# Patient Record
Sex: Female | Born: 1975
Health system: Southern US, Community
[De-identification: ages and names within clinical notes are randomized; demographics above are authoritative.]

---

## 2005-06-30 ENCOUNTER — Emergency Department: Payer: Self-pay | Admitting: Emergency Medicine

## 2006-11-29 ENCOUNTER — Emergency Department: Payer: Self-pay | Admitting: Emergency Medicine

## 2010-07-26 ENCOUNTER — Emergency Department: Payer: Self-pay | Admitting: Internal Medicine

## 2012-07-02 ENCOUNTER — Encounter: Payer: Self-pay | Admitting: Family Medicine

## 2012-08-01 ENCOUNTER — Emergency Department: Payer: Self-pay | Admitting: Emergency Medicine

## 2012-08-04 ENCOUNTER — Emergency Department: Payer: Self-pay | Admitting: Emergency Medicine

## 2014-01-13 ENCOUNTER — Ambulatory Visit: Payer: Self-pay | Admitting: Family Medicine

## 2015-02-21 IMAGING — US US BREAST*R* LIMITED INC AXILLA
1 series · 6 of 6 positions shown · non-contrast
Comparison: None.

CLINICAL DATA: Patient presents for a bilateral diagnostic
evaluation due to a palpable abnormality over the 10 o'clock
position of the right breast first noticed 6 months ago. Patient
states that she can no longer feel this abnormality.

EXAM:
DIGITAL DIAGNOSTIC  BILATERAL MAMMOGRAM WITH CAD
ULTRASOUND RIGHT BREAST

[Series 1: us breast*right* limited inc axilla · 0.08mm/px · 6 of 6 slices shown]
[im 1/6]
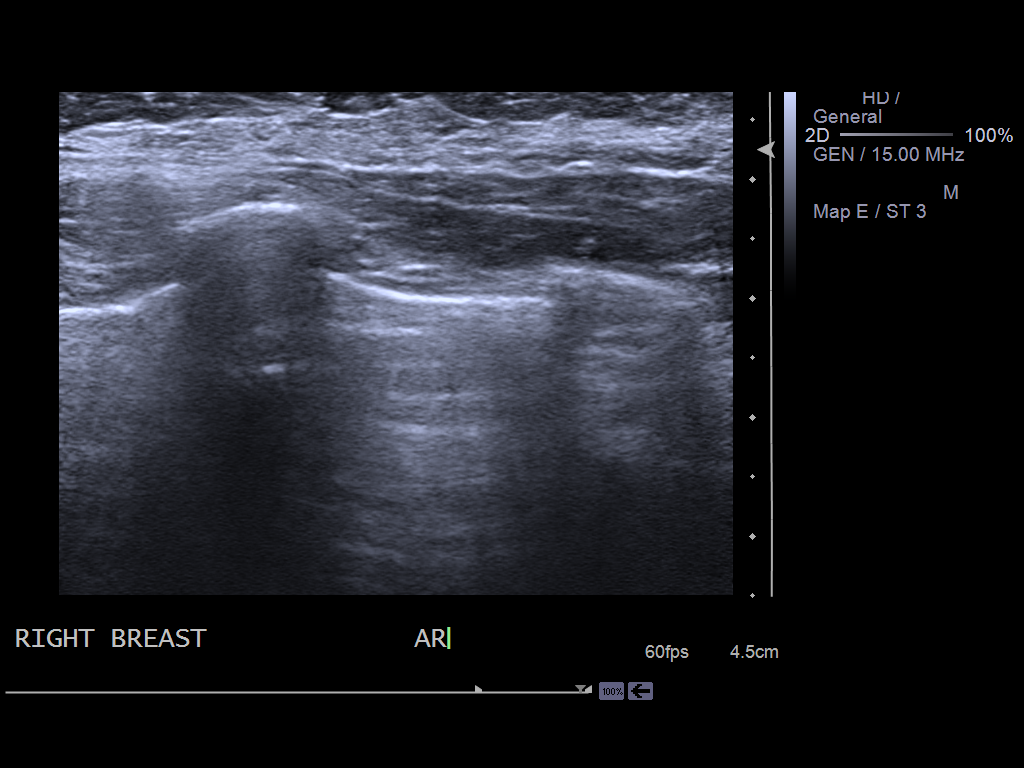
[im 2/6]
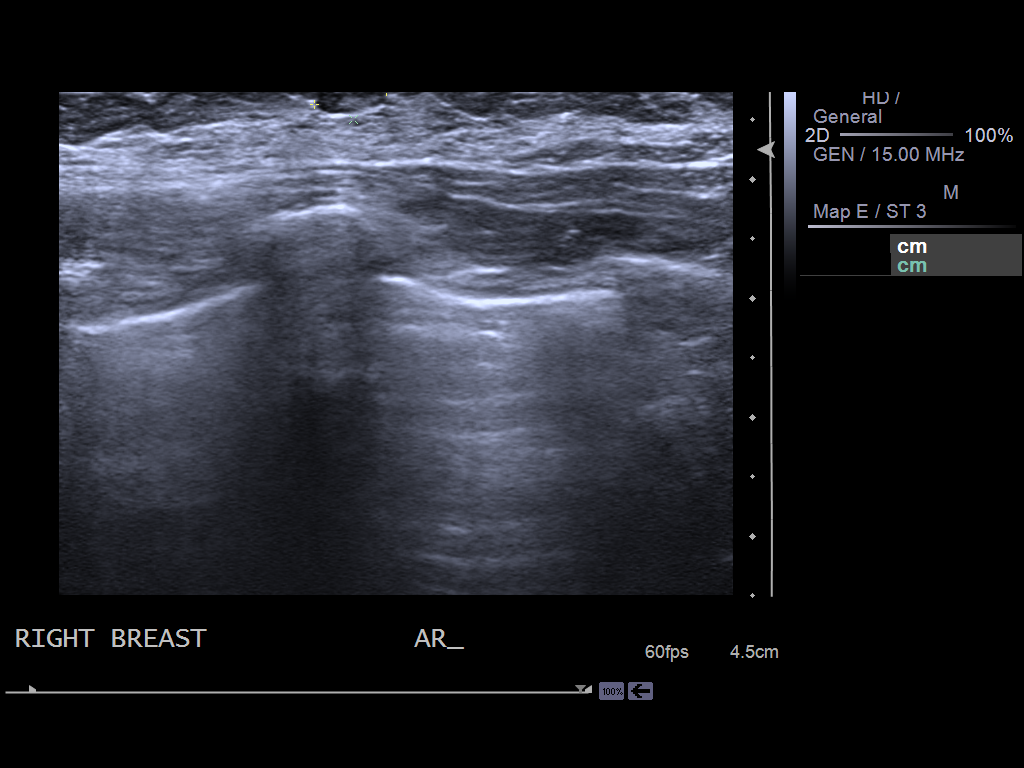
[im 3/6]
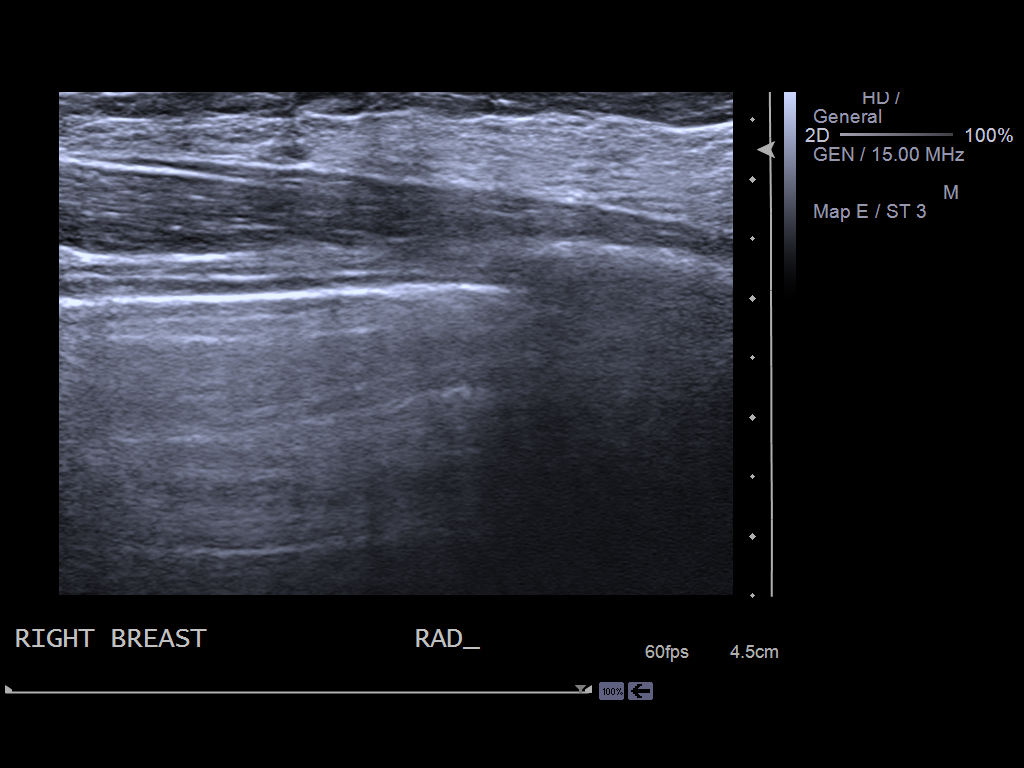
[im 4/6]
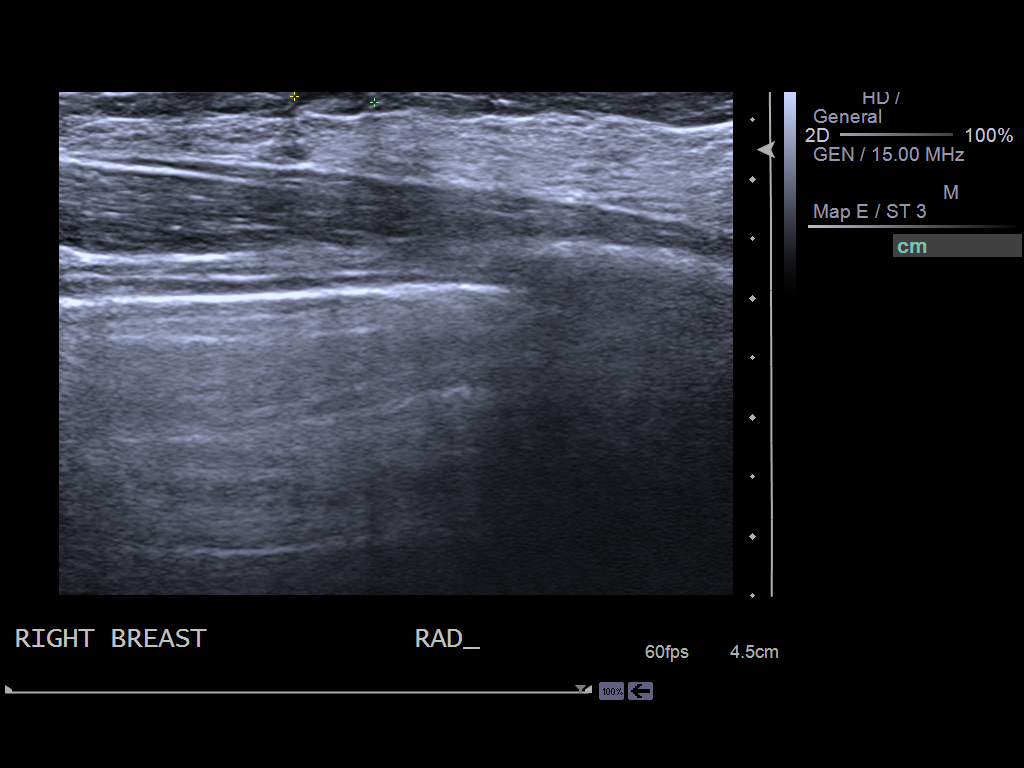
[im 5/6]
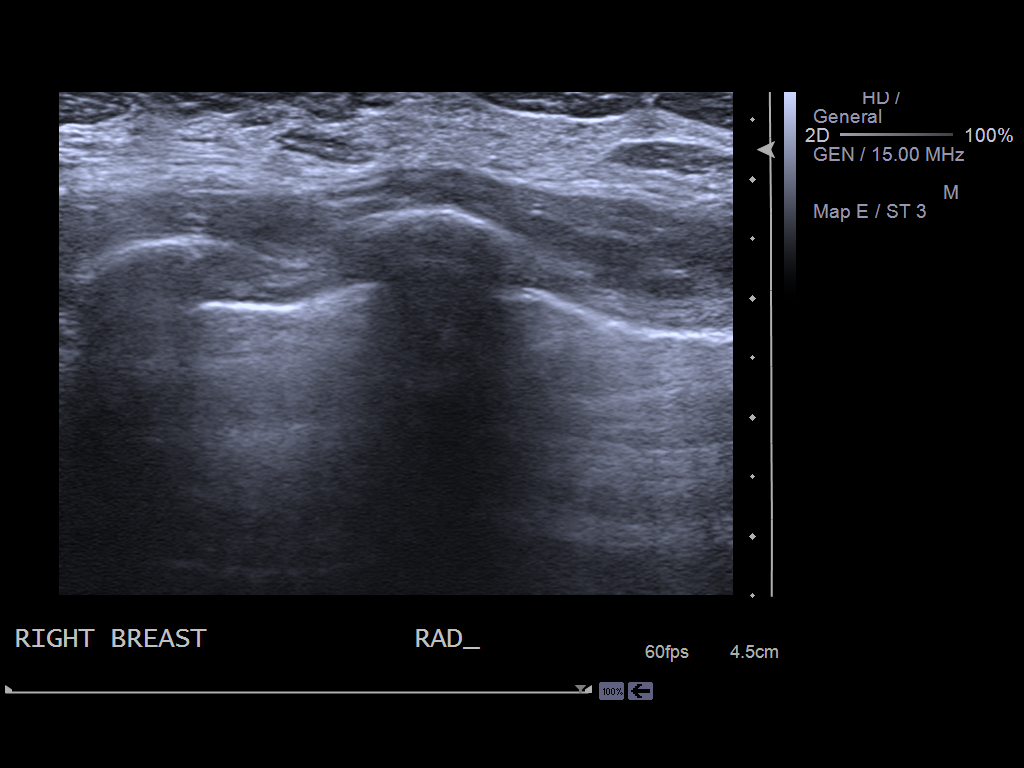
[im 6/6]
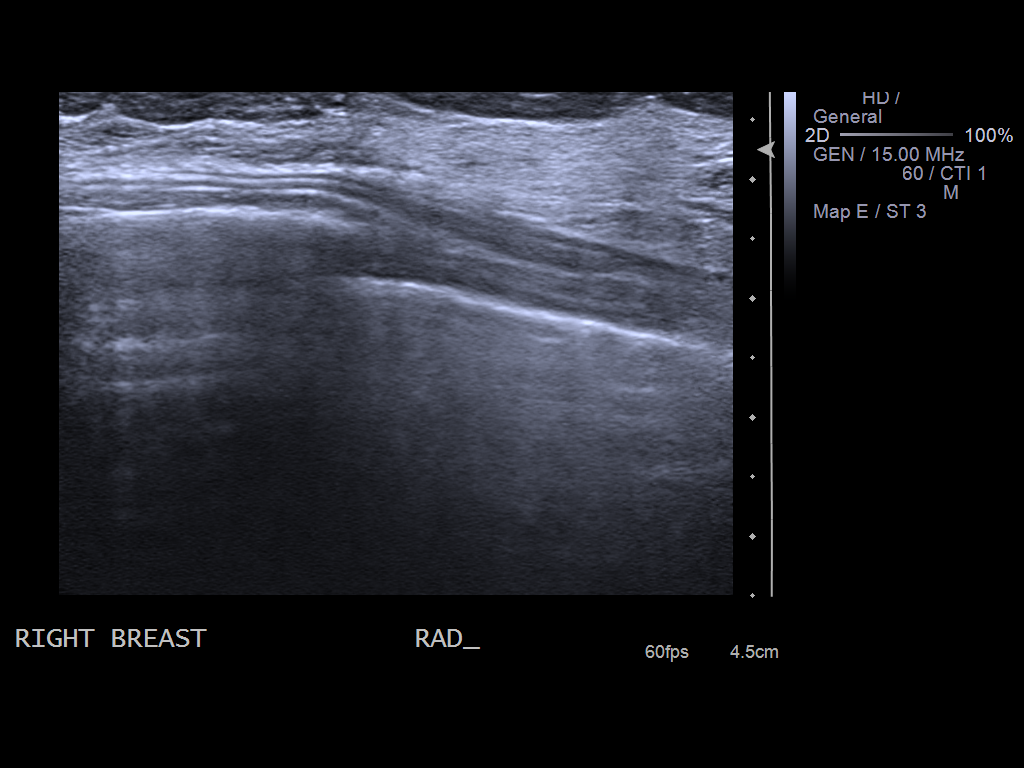

[6 of 6 positions shown; findings below may reference images not displayed]

ACR Breast Density Category b: There are scattered areas of
fibroglandular density.
FINDINGS: Examination demonstrates 2 small ovoid circumscribed masses in the
area of patient's previous palpable abnormality measuring 3 mm and 6
mm respectively over the upper outer breast. Remainder of the exam
is unremarkable.

Mammographic images were processed with CAD.

On physical exam, I palpate a small 5-6 mm superficial nodular
density over the 10 to 11 o'clock position of the right breast
approximately 8 cm from the nipple.

Ultrasound is performed, showing 2 adjacent circumscribed ovoid
intramammary lymph nodes immediately below the skin surface at the
10 to 11 o'clock position of the right breast 8 cm from the nipple
with the larger measuring 3 x 6 x 7 mm in the smaller measuring 3 x
4 x 4 mm. These correspond to the area of patient's previous
palpable abnormality.
IMPRESSION: Two adjacent intramammary lymph nodes at the 10 to 11 o'clock
position of the right breast 8 cm from the nipple with the larger
measuring 3 x 6 x 7 mm likely accounting for patient's palpable
abnormality.

RECOMMENDATION:
Recommend annual bilateral mammographic screening beginning at age
40.

I have discussed the findings and recommendations with the patient.
Results were also provided in writing at the conclusion of the
visit. If applicable, a reminder letter will be sent to the patient
regarding the next appointment.

BI-RADS CATEGORY  2: Benign.

## 2015-10-19 ENCOUNTER — Emergency Department
Admission: EM | Admit: 2015-10-19 | Discharge: 2015-10-19 | Disposition: A | Discharge status: Home / Self Care | Payer: Self-pay

## 2015-10-19 NOTE — ED Notes (Signed)
When pt was aware that her supervisor was to be notified for workers comp request pt became very agitated and got up and stormed out stating she did not want to be seen, pt left ER

## 2016-11-08 ENCOUNTER — Encounter: Payer: Self-pay | Admitting: Advanced Practice Midwife

## 2018-07-17 ENCOUNTER — Emergency Department: Payer: BLUE CROSS/BLUE SHIELD

## 2018-07-17 ENCOUNTER — Emergency Department
Admission: EM | Admit: 2018-07-17 | Discharge: 2018-07-17 | Disposition: A | Discharge status: Home / Self Care | Payer: BLUE CROSS/BLUE SHIELD | Attending: Student in an Organized Health Care Education/Training Program | Admitting: Student in an Organized Health Care Education/Training Program

## 2018-07-17 ENCOUNTER — Encounter: Payer: Self-pay | Admitting: *Deleted

## 2018-07-17 ENCOUNTER — Other Ambulatory Visit: Payer: Self-pay

## 2018-07-17 DIAGNOSIS — R0789 Other chest pain: Secondary | ICD-10-CM | POA: Insufficient documentation

## 2018-07-17 DIAGNOSIS — R079 Chest pain, unspecified: Secondary | ICD-10-CM

## 2018-07-17 LAB — BASIC METABOLIC PANEL
Anion gap: 6 (ref 5–15)
BUN: 11 mg/dL (ref 6–20)
CO2: 26 mmol/L (ref 22–32)
Calcium: 8.9 mg/dL (ref 8.9–10.3)
Chloride: 104 mmol/L (ref 98–111)
Creatinine, Ser: 0.55 mg/dL (ref 0.44–1.00)
GFR calc Af Amer: >60 mL/min (ref >60)
GFR calc non Af Amer: >60 mL/min (ref >60)
Glucose, Bld: 89 mg/dL (ref 70–99)
Potassium: 3.9 mmol/L (ref 3.5–5.1)
Sodium: 136 mmol/L (ref 135–145)

## 2018-07-17 LAB — CBC
HEMATOCRIT: 38.2 % (ref 36.0–46.0)
Hemoglobin: 12 g/dL (ref 12.0–15.0)
MCH: 27.8 pg (ref 26.0–34.0)
MCHC: 31.4 g/dL (ref 30.0–36.0)
MCV: 88.4 fL (ref 80.0–100.0)
Platelets: 269 10*3/uL (ref 150–400)
RBC: 4.32 MIL/uL (ref 3.87–5.11)
RDW: 13.3 % (ref 11.5–15.5)
WBC: 6.1 10*3/uL (ref 4.0–10.5)
nRBC: 0 % (ref 0.0–0.2)

## 2018-07-17 LAB — TROPONIN I: Troponin I: <0.03 ng/mL (ref <0.03)

## 2018-07-17 MED ORDER — CYCLOBENZAPRINE HCL 10 MG PO TABS: 10.0000 mg | ORAL_TABLET | Freq: Three times a day (TID) | ORAL | 0 refills | Status: AC | PRN

## 2018-07-17 NOTE — ED Provider Notes (Signed)
[facility] Emergency Department Provider Note    First MD Initiated Contact with Patient 07/17/18 1539     (approximate)  I have reviewed the triage vital signs and the nursing notes.   HISTORY  Chief Complaint Chest Pain    HPI Isaias Sakaiishira Pablo is a 43 y.o. female below listed past medical history presents the ER with intermittent brief stabbing chest discomfort in the anterior chest wall the last roughly 3 to 5 minutes.  Happened 2 times yesterday.  It was improved after drinking water.  Denied any diaphoresis nausea or vomiting.  Did not feel like she was about to faint.  Denies any shortness of breath.  No pleuritic pain.  She does not smoke.  Is not on OCP.  Denies any history of heart disease.  No recent fevers.  Currently pain-free.  Works [employer] doing lots of heavy lifting and pulling.    History reviewed. No pertinent past medical history. History reviewed. No pertinent family history. Past Surgical History:  Procedure Laterality Date  . CESAREAN SECTION     There are no active problems to display for this patient.     Prior to Admission medications   Medication Sig Start Date End Date Taking? Authorizing Provider  cyclobenzaprine (FLEXERIL) 10 MG tablet Take 1 tablet (10 mg total) by mouth 3 (three) times daily as needed for muscle spasms. 07/17/18   Willy Eddyobinson, Winry Egnew, MD    Allergies Patient has no allergy information on record.    Social History Social History   Tobacco Use  . Smoking status: Never Smoker  . Smokeless tobacco: Never Used  Substance Use Topics  . Alcohol use: Never    Frequency: Never  . Drug use: Never    Review of Systems Patient denies headaches, rhinorrhea, blurry vision, numbness, shortness of breath, chest pain, edema, cough, abdominal pain, nausea, vomiting, diarrhea, dysuria, fevers, rashes or hallucinations unless otherwise stated above in  HPI. ____________________________________________   PHYSICAL EXAM:  VITAL SIGNS: Vitals:   07/17/18 1410  BP: 118/72  Pulse: 72  Resp: 16  Temp: 98.5 F (36.9 C)  SpO2: 100%    Constitutional: Alert and oriented.  Eyes: Conjunctivae are normal.  Head: Atraumatic. Nose: No congestion/rhinnorhea. Mouth/Throat: Mucous membranes are moist.   Neck: No stridor. Painless ROM.  Cardiovascular: Normal rate, regular rhythm. Grossly normal heart sounds.  Good peripheral circulation. Respiratory: Normal respiratory effort.  No retractions. Lungs CTAB. Gastrointestinal: Soft and nontender. No distention. No abdominal bruits. No CVA tenderness. Genitourinary:  Musculoskeletal: No lower extremity tenderness nor edema.  No joint effusions. Neurologic:  Normal speech and language. No gross focal neurologic deficits are appreciated. No facial droop Skin:  Skin is warm, dry and intact. No rash noted. Psychiatric: Mood and affect are normal. Speech and behavior are normal.  ____________________________________________   LABS (all labs ordered are listed, but only abnormal results are displayed)  Results for orders placed or performed during the hospital encounter of 07/17/18 (from the past 24 hour(s))  Basic metabolic panel     Status: None   Collection Time: 07/17/18  2:16 PM  Result Value Ref Range   Sodium 136 135 - 145 mmol/L   Potassium 3.9 3.5 - 5.1 mmol/L   Chloride 104 98 - 111 mmol/L   CO2 26 22 - 32 mmol/L   Glucose, Bld 89 70 - 99 mg/dL   BUN 11 6 - 20 mg/dL   Creatinine, Ser 1.610.55 0.44 - 1.00 mg/dL  Calcium 8.9 8.9 - 10.3 mg/dL   GFR calc non Af Amer >60 >60 mL/min   GFR calc Af Amer >60 >60 mL/min   Anion gap 6 5 - 15  CBC     Status: None   Collection Time: 07/17/18  2:16 PM  Result Value Ref Range   WBC 6.1 4.0 - 10.5 K/uL   RBC 4.32 3.87 - 5.11 MIL/uL   Hemoglobin 12.0 12.0 - 15.0 g/dL   HCT 24.8 25.0 - 03.7 %   MCV 88.4 80.0 - 100.0 fL   MCH 27.8 26.0 - 34.0  pg   MCHC 31.4 30.0 - 36.0 g/dL   RDW 04.8 88.9 - 16.9 %   Platelets 269 150 - 400 K/uL   nRBC 0.0 0.0 - 0.2 %  Troponin I - ONCE - STAT     Status: None   Collection Time: 07/17/18  2:16 PM  Result Value Ref Range   Troponin I <0.03 <0.03 ng/mL   ____________________________________________  EKG My review and personal interpretation at Time: 13"52   Indication: chest pain  Rate: 75  Rhythm: sinus Axis: normal Other: normal intervals, no stemi ____________________________________________  RADIOLOGY  I personally reviewed all radiographic images ordered to evaluate for the above acute complaints and reviewed radiology reports and findings.  These findings were personally discussed with the patient.  Please see medical record for radiology report.  ____________________________________________   PROCEDURES  Procedure(s) performed:  Procedures    Critical Care performed: no ____________________________________________   INITIAL IMPRESSION / ASSESSMENT AND PLAN / ED COURSE  Pertinent labs & imaging results that were available during my care of the patient were reviewed by me and considered in my medical decision making (see chart for details).   DDX: ACS, pericarditis, esophagitis, boerhaaves, pe, dissection, pna, bronchitis, costochondritis   Beckett Vecchiarelli is a 43 y.o. who presents to the ED with symptoms as described above.  Patient afebrile hemodynamically stable.  Blood work sent for the above differential is reassuring.  She is low risk heart score.  This not consistent with ACS particular as the patient is pain-free at this time.  Doubt dysrhythmia.  Patient is low risk by Wells criteria and is PERC negative.  No evidence of pneumothorax or pneumonia.  No evidence of CHF.  No evidence of intra-abdominal process causing chest discomfort.  Not consistent with dissection.  She stable and appropriate for outpatient follow-up.      As part of my medical decision making, I  reviewed the following data within the electronic MEDICAL RECORD NUMBER Nursing notes reviewed and incorporated, Labs reviewed, notes from prior ED visits.   ____________________________________________   FINAL CLINICAL IMPRESSION(S) / ED DIAGNOSES  Final diagnoses:  Chest pain, unspecified type      NEW MEDICATIONS STARTED DURING THIS VISIT:  New Prescriptions   CYCLOBENZAPRINE (FLEXERIL) 10 MG TABLET    Take 1 tablet (10 mg total) by mouth 3 (three) times daily as needed for muscle spasms.     Note:  This document was prepared using Dragon voice recognition software and may include unintentional dictation errors.    Willy Eddy, MD 07/17/18 [phone]

## 2018-07-17 NOTE — ED Triage Notes (Signed)
PT to ED reporting centralized sharp pains in her chest that started yesterday and are intermittent in nature. No cough or congestion. No SOB od dizziness. NO tenderness upon palpation.

## 2018-07-17 NOTE — ED Notes (Signed)
Pt reports chest pain located across chest (more on right side) non radiating that started yesterday - denies shortness of breath, dizziness, N/V

## 2018-07-19 DIAGNOSIS — E7800 Pure hypercholesterolemia, unspecified: Secondary | ICD-10-CM | POA: Diagnosis not present

## 2018-07-19 DIAGNOSIS — E663 Overweight: Secondary | ICD-10-CM | POA: Diagnosis not present

## 2018-07-19 DIAGNOSIS — E049 Nontoxic goiter, unspecified: Secondary | ICD-10-CM | POA: Diagnosis not present

## 2018-07-19 DIAGNOSIS — J309 Allergic rhinitis, unspecified: Secondary | ICD-10-CM | POA: Diagnosis not present

## 2018-07-19 DIAGNOSIS — N3281 Overactive bladder: Secondary | ICD-10-CM | POA: Diagnosis not present

## 2018-08-28 DIAGNOSIS — D649 Anemia, unspecified: Secondary | ICD-10-CM | POA: Diagnosis not present

## 2018-08-28 DIAGNOSIS — E049 Nontoxic goiter, unspecified: Secondary | ICD-10-CM | POA: Diagnosis not present

## 2018-08-28 DIAGNOSIS — E663 Overweight: Secondary | ICD-10-CM | POA: Diagnosis not present

## 2018-08-28 DIAGNOSIS — J309 Allergic rhinitis, unspecified: Secondary | ICD-10-CM | POA: Diagnosis not present

## 2018-08-28 DIAGNOSIS — N3281 Overactive bladder: Secondary | ICD-10-CM | POA: Diagnosis not present

## 2019-01-24 ENCOUNTER — Ambulatory Visit (INDEPENDENT_AMBULATORY_CARE_PROVIDER_SITE_OTHER): Payer: Worker's Compensation | Admitting: Orthopaedic Surgery

## 2019-01-24 ENCOUNTER — Telehealth: Payer: Self-pay | Admitting: Orthopaedic Surgery

## 2019-01-24 ENCOUNTER — Other Ambulatory Visit: Payer: Self-pay

## 2019-01-24 ENCOUNTER — Ambulatory Visit (INDEPENDENT_AMBULATORY_CARE_PROVIDER_SITE_OTHER): Payer: Worker's Compensation

## 2019-01-24 DIAGNOSIS — S52135A Nondisplaced fracture of neck of left radius, initial encounter for closed fracture: Secondary | ICD-10-CM | POA: Diagnosis not present

## 2019-01-24 MED ORDER — TRAMADOL HCL ER 100 MG PO TB24: 100.0000 mg | ORAL_TABLET | Freq: Three times a day (TID) | ORAL | 2 refills | Status: AC | PRN

## 2019-01-24 NOTE — Telephone Encounter (Signed)
Received call from Vinie Sill with [employer] called asked if patient can come back to work on light duty? Lars Mage said the patient can use her right arm and can walk.  The number to contact Lars Mage is [phone] Ext: [phone]

## 2019-01-24 NOTE — Progress Notes (Signed)
   Office Visit Note   Patient: Sheila Holden           Date of Birth: Jul 23, 1975           MRN: [id number] Visit Date: 01/24/2019              Requested by: No referring provider defined for this encounter. PCP: Patient, No Pcp Per   Assessment & Plan: Visit Diagnoses:  1. Closed nondisplaced fracture of neck of left radius, initial encounter     Plan: Impression is left elbow nondisplaced radial neck fracture.  We will have the patient wear her sling for maximum of 10 days.  She will then start in occupational therapy.  A prescription was provided to her today for this.  She will be nonweightbearing for 4 weeks.  She will follow-up with [place] in 2 weeks time for repeat evaluation and 3-view x-rays of the left elbow.  Prescription for tramadol was called in today.  Out of work note for 8 weeks provided as well as there is no light duty Follow-Up Instructions: Return in about 2 weeks (around 02/07/2019).   Orders:  Orders Placed This Encounter  Procedures  . XR Elbow Complete Left (3+View)   Meds ordered this encounter  Medications  . traMADol (ULTRAM-ER) 100 MG 24 hr tablet    Sig: Take 1 tablet (100 mg total) by mouth 3 (three) times daily as needed for pain.    Dispense:  30 tablet    Refill:  2      Procedures: No procedures performed   Clinical Data: No additional findings.   Subjective: Chief Complaint  Patient presents with  . Left Elbow - Injury, Fracture    HPI patient is a pleasant 43 year old female who presents to our clinic today with an injury to her left elbow.  She works for the [employer] and fell over trash yesterday landing on her left elbow.  She comes in today for further evaluation treatment recommendation.  The pain she has is to the lateral elbow.  Worse with any motion.  She has tried Motrin with mild relief of symptoms.  Review of Systems as detailed in HPI.  All others reviewed and are negative.   Objective: Vital Signs:  There were no vitals taken for this visit.  Physical Exam well-developed and well-nourished female in no acute distress.  Oriented x3.  Ortho Exam examination of her left elbow reveals marked tenderness over the radial head.  Limited range of motion secondary to pain.  She is neurovascularly intact distally.  Specialty Comments:  No specialty comments available.  Imaging: Xr Elbow Complete Left (3+view)  Result Date: 01/24/2019 X-rays demonstrate a nondisplaced radial neck fracture    PMFS History: There are no active problems to display for this patient.  No past medical history on file.  No family history on file.  Past Surgical History:  Procedure Laterality Date  . CESAREAN SECTION     Social History   Occupational History  . Not on file  Tobacco Use  . Smoking status: Never Smoker  . Smokeless tobacco: Never Used  Substance and Sexual Activity  . Alcohol use: Never    Frequency: Never  . Drug use: Never  . Sexual activity: Yes

## 2019-01-24 NOTE — Telephone Encounter (Signed)
Seth/General Manager called and stated that he wanted to know about patients visit today and wanted to speak to [employer] about possibly assigning light duty vs. Out for 8 weeks.  Advised due to HIPPA this would be a violation to speak about patient's medical condition without her consent(signed form) told him I wasn't allowed to give any information and advised I would send message to [place] office.Asked if patient had Case mgr?. He stated one hasn't been assigned to her yet.  Requested call [phone] Seth/GM

## 2019-01-24 NOTE — Telephone Encounter (Signed)
Yes no use of the affected arm

## 2019-01-24 NOTE — Telephone Encounter (Signed)
Please advise 

## 2019-01-24 NOTE — Telephone Encounter (Signed)
I don't even know who to send this to

## 2019-01-27 NOTE — Telephone Encounter (Signed)
Ok ,it's in the chart

## 2019-01-27 NOTE — Telephone Encounter (Signed)
She may return to work without use of affected extremity.  Thanks.

## 2019-01-27 NOTE — Telephone Encounter (Signed)
Is this WC?

## 2019-01-28 ENCOUNTER — Telehealth: Payer: Self-pay | Admitting: Orthopaedic Surgery

## 2019-01-28 NOTE — Telephone Encounter (Signed)
Ok to keep out of work until f/u appointment

## 2019-01-28 NOTE — Telephone Encounter (Signed)
Ok to write for desk work only

## 2019-01-28 NOTE — Telephone Encounter (Signed)
IC advised could pick up at front desk.  

## 2019-01-28 NOTE — Telephone Encounter (Signed)
IC s/w patient. 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 She is having a hard time getting in and out of truck due to arm being so painful. Please advise.

## 2019-01-28 NOTE — Telephone Encounter (Signed)
I s/w patient to advise and she stated that she feels like that she needs to be taken out of work. Please advise. If so for how long?

## 2019-01-28 NOTE — Telephone Encounter (Signed)
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  She feels that if she has to get in and out of the truck she will only aggravate the injury.  She is wanting someone to give her a call.  CB#[phone].  Thank you.

## 2019-01-28 NOTE — Telephone Encounter (Signed)
call

## 2019-01-29 ENCOUNTER — Telehealth: Payer: Self-pay

## 2019-01-29 ENCOUNTER — Telehealth: Payer: Self-pay | Admitting: Orthopaedic Surgery

## 2019-01-29 NOTE — Telephone Encounter (Signed)
Emailed the note to Seth Heath with employer ([email or web address] 

## 2019-01-29 NOTE — Telephone Encounter (Signed)
Pt called and asked that I email her oow note that was written yesterday to her employer [employer]. Emailed to [email or web address]

## 2019-01-29 NOTE — Telephone Encounter (Signed)
Emailed the note to [employer] with employer ([email or web address]

## 2019-02-05 ENCOUNTER — Ambulatory Visit: Payer: Worker's Compensation | Attending: Orthopaedic Surgery | Admitting: Occupational Therapy

## 2019-02-05 ENCOUNTER — Other Ambulatory Visit: Payer: Self-pay

## 2019-02-05 DIAGNOSIS — M25622 Stiffness of left elbow, not elsewhere classified: Secondary | ICD-10-CM | POA: Insufficient documentation

## 2019-02-05 DIAGNOSIS — M6281 Muscle weakness (generalized): Secondary | ICD-10-CM | POA: Diagnosis present

## 2019-02-05 DIAGNOSIS — M25522 Pain in left elbow: Secondary | ICD-10-CM | POA: Diagnosis present

## 2019-02-05 NOTE — Patient Instructions (Addendum)
AROM: Elbow Flexion / Extension    Gently bend elbow as far as possible. Then straighten arm as far as possible. Repeat _10-15___ times per set. Do _4-6___ sessions per day.    Elbow / Wrist Supination / Pronation   Bend elbow(s) to 90 and hold close to body. Turn palm up. Then turn palm(s) down. Keep wrist straight. Repeat sequence _10-15___ times per session. Do _4-6___ sessions per day.   Shoulder Flexion: Sitting (Single Arm)    Do not use a band, just raise arm on its own Same side hand thumb up, raise arm forward. Repeat 10__ times per set left arm Do _1_ sets per session. 2-3x day  http://tub.exer.us/282   Copyright  VHI. All rights reserved.     SHOULDER: Abduction Bilateral    Raise left arm arms out  with palms up. Keep elbows straight. Do not shrug shoulders. 10___ reps per set, __3_ sets per day, _7__ days per week   Copyright  VHI. All rights reserved.    Remove your sling if you are watching TV and can support your arm on a pillow, wear your sling for protection when up moving around or sleeping

## 2019-02-05 NOTE — Therapy (Signed)
[facility] [address] [place], [place], [address] Phone: [phone]   Fax:  [phone]  Occupational Therapy Evaluation  Patient Details  Name: Sheila Holden MRN: [id number] Date of Birth: 05-30-1976 Referring Provider (OT): Dr. Roda ShuttersXu   Encounter Date: 02/05/2019  OT End of Session - 02/05/19 1248    Visit Number  1    Number of Visits  25    Date for OT Re-Evaluation  05/06/19    OT Start Time  1105    OT Stop Time  1150    OT Time Calculation (min)  45 min    Activity Tolerance  Patient tolerated treatment well    Behavior During Therapy  [facility] for tasks assessed/performed       No past medical history on file.  Past Surgical History:  Procedure Laterality Date  . CESAREAN SECTION      There were no vitals filed for this visit.  Subjective Assessment - 02/05/19 1212    Subjective   Pt reports pain in LUE    Patient Stated Goals  regain use of LUE    Currently in Pain?  Yes    Pain Score  6     Pain Location  Arm    Pain Orientation  Left    Pain Descriptors / Indicators  Aching    Pain Type  Acute pain    Pain Onset  1 to 4 weeks ago    Pain Frequency  Intermittent    Aggravating Factors   movement    Pain Relieving Factors  rest        [facility] - 02/05/19 1111      Assessment   Medical Diagnosis  L elbow radial neck fx    Referring Provider (OT)  Dr. Roda ShuttersXu    Onset Date/Surgical Date  01/24/19   MD appt   Hand Dominance  Right      Precautions   Precautions  Other (comment)    Precaution Comments  sling for comfort      Restrictions   Weight Bearing Restrictions  Yes    LUE Weight Bearing  Non weight bearing    Other Position/Activity Restrictions  x 4 weeks from 01/24/2019      Balance Screen   Has the patient fallen in the past 6 months  Yes    How many times?  1    Has the patient had a decrease in activity level because of a fear of falling?   Yes    Is the patient  reluctant to leave their home because of a fear of falling?   No      Home  Environment   Family/patient expects to be discharged to:  Private residence    Living Arrangements  Spouse/significant other    Lives With  Significant other      Prior Function   Level of Independence  Independent    Vocation  Full time employment    Naval architectVocation Requirements  driver for garbage    Leisure  reading, gardening      ADL   Eating/Feeding  Modified independent    Grooming  Modified independent    Upper Body Bathing  Modified independent   uses RUE   Lower Body Bathing  Modified independent    Upper Body Dressing  Moderate assistance    Lower Body Dressing  Minimal assistance    Music therapistToilet Transfer  Modified independent    Tub/Shower Transfer  Modified independent      IADL   Shopping  Completely unable to shop    Light Housekeeping  Performs light daily tasks but cannot maintain acceptable level of cleanliness    Meal Prep  Needs to have meals prepared and served      Mobility   Mobility Status  Independent      Written Expression   Dominant Hand  Right      Sensation   Light Touch  Appears Intact      Coordination   Gross Motor Movements are Fluid and Coordinated  No      ROM / Strength   AROM / PROM / Strength  AROM      AROM   Overall AROM   Deficits    Overall AROM Comments  LUE elbow flexion: 100, extension -30, pronation 60, supination 85 shoulder flexion 125               Hotpack applied to left elbow and shoulder x 10 mins prior to education regarding HEP. No adverse reactions.       OT Education - 02/05/19 1232    Education Details  A/ROM HEP    Person(s) Educated  Patient    Methods  Explanation;Demonstration;Verbal cues;Handout    Comprehension  Verbalized understanding;Returned demonstration;Verbal cues required       OT Short Term Goals - 02/05/19 1224      OT SHORT TERM GOAL #1   Title  I with HEP.    Time  6    Period  Weeks    Status  New     Target Date  03/22/19      OT SHORT TERM GOAL #2   Title  Pt will increase elbow flexion to 120  for increased functional use    Baseline  100    Time  6    Period  Weeks    Status  New      OT SHORT TERM GOAL #3   Title  Pt will demonxtrate elbow ext to at least -15 in prep for functional reach.    Time  6    Period  Weeks    Status  New      OT SHORT TERM GOAL #4   Title  Pt will demonstrate forearm pronation to 85 for increased functional use.    Time  4    Period  Weeks    Status  New      OT SHORT TERM GOAL #5   Title  Pt will report pain no greater than 4/10 with perfromance of HEP.    Time  4    Period  Weeks    Status  New      OT SHORT TERM GOAL #6   Title  Pt will perform all basic ADLs modified indpendently using LUE as a non dominant assist.    Time  6    Period  Weeks    Status  New        OT Long Term Goals - 02/05/19 1241      OT LONG TERM GOAL #1   Title  I updated with HEP.    Time  12    Period  Weeks    Status  New      OT LONG TERM GOAL #2   Title  Pt will demonstrate elbow flexion/ extension WNLS for ADLs/ work activities.    Time  12    Period  Weeks  Status  New      OT LONG TERM GOAL #3   Title  Pt will demonstrate adequate LUE strength to retrieve a 3 lbs object from overhead shelf with LUE with pain no greater than 2/10.    Time  12    Period  Weeks    Status  New      OT LONG TERM GOAL #4   Title  Pt will resume performance of home management and cooking activities modified independently using LUE as a non dominant assist.    Time  12    Period  Weeks    Status  New      OT LONG TERM GOAL #5   Title  Pt will perfrom simulated work activities modified indpendently with pain no greater than 2/10,    Time  12    Period  Weeks    Status  New            Plan - 02/05/19 1218    Clinical Impression Statement  Pt is a 43 y.o female who sustained a left radial neck fx and was seen byDr. Erlinda Hong on 01/24/2019. Pt works for  [employer] and she was injured on the job. Pt presents to occupational therapy with the following deficits: pain, decreased ROM, decreased strength and decreased LUE functional use which impedes performance of ADLs/ IADLs. Pt can benefit from skilled occupational therapy to maximize pt safety and independence with daily activities.    OT Occupational Profile and History  Problem Focused Assessment - Including review of records relating to presenting problem    Occupational performance deficits (Please refer to evaluation for details):  ADL's;IADL's;Rest and Sleep;Work;Leisure;Social Participation    Body Structure / Function / Physical Skills  ADL;UE functional use;Pain;Flexibility;FMC;ROM;Gait;Coordination;GMC;Decreased knowledge of precautions;IADL;Strength;Dexterity    Rehab Potential  Good    Clinical Decision Making  Limited treatment options, no task modification necessary    Comorbidities Affecting Occupational Performance:  None    Modification or Assistance to Complete Evaluation   No modification of tasks or assist necessary to complete eval    OT Frequency  2x / week   plus eval   OT Duration  12 weeks    OT Treatment/Interventions  Self-care/ADL training;Ultrasound;Energy conservation;Aquatic Therapy;DME and/or AE instruction;Patient/family education;Balance training;Passive range of motion;Cryotherapy;Fluidtherapy;Splinting;Functional Mobility Training;Electrical Stimulation;Contrast Bath;Paraffin;Moist Heat;Therapeutic exercise;Manual Therapy;Therapeutic activities;Neuromuscular education    Plan  continue gentle A/ROM    OT Home Exercise Plan  issued gentle elbow, forearm and shoulder ROM       Patient will benefit from skilled therapeutic intervention in order to improve the following deficits and impairments:   Body Structure / Function / Physical Skills: ADL, UE functional use, Pain, Flexibility, FMC, ROM, Gait, Coordination, GMC, Decreased knowledge of precautions, IADL,  Strength, Dexterity       Visit Diagnosis: 1. Stiffness of left elbow, not elsewhere classified   2. Pain in left elbow   3. Muscle weakness (generalized)       Problem List There are no active problems to display for this patient.   Bernedette Auston 02/05/2019, 1:31 PM  [facility] [address] [place], [place], [address] Phone: [phone]   Fax:  [phone]  Name: Sheila Holden MRN: [id number] Date of Birth: [birth year]

## 2019-02-07 ENCOUNTER — Ambulatory Visit (INDEPENDENT_AMBULATORY_CARE_PROVIDER_SITE_OTHER): Payer: Worker's Compensation

## 2019-02-07 ENCOUNTER — Ambulatory Visit (INDEPENDENT_AMBULATORY_CARE_PROVIDER_SITE_OTHER): Payer: Worker's Compensation | Admitting: Orthopaedic Surgery

## 2019-02-07 ENCOUNTER — Encounter: Payer: Self-pay | Admitting: Orthopaedic Surgery

## 2019-02-07 VITALS — Ht 65.0 in | Wt 173.0 lb

## 2019-02-07 DIAGNOSIS — M25522 Pain in left elbow: Secondary | ICD-10-CM

## 2019-02-07 NOTE — Progress Notes (Signed)
   Office Visit Note   Patient: Sheila Holden           Date of Birth: [birth year]           MRN: [id number] Visit Date: 02/07/2019              Requested by: No referring provider defined for this encounter. PCP: Patient, No Pcp Per   Assessment & Plan: Visit Diagnoses:  1. Pain in left elbow     Plan: At this point she is to continue her OT.  I will write her out of work for another 2 weeks.  She may wean the sling as tolerated.  Follow-up in 4 weeks for recheck.  No x-rays needed if she is doing well.  Follow-Up Instructions: Return in about 4 weeks (around 03/07/2019).   Orders:  Orders Placed This Encounter  Procedures  . XR Elbow Complete Left (3+View)   No orders of the defined types were placed in this encounter.     Procedures: No procedures performed   Clinical Data: No additional findings.   Subjective: Chief Complaint  Patient presents with  . Left Elbow - Follow-up    DOI 01/23/2019    Sheila Holden returns today for follow-up of her left elbow.  She states that the pain is improving as well as the swelling is improving.  She has good and bad days.  She has been out of work.  She has begun OT this past week.   Review of Systems   Objective: Vital Signs: Ht 5\' 5"  (1.651 m)   Wt 173 lb (78.5 kg)   LMP 01/07/2019   BMI 28.79 kg/m   Physical Exam  Ortho Exam Elbow range of motion is actually pretty good.  She has full pronation supination. Specialty Comments:  No specialty comments available.  Imaging: Xr Elbow Complete Left (3+view)  Result Date: 02/07/2019 There is some spurring of the radial head.  Spurring of the proximal radial ulnar joint.  No obvious evidence of fracture lucency.    PMFS History: There are no active problems to display for this patient.  No past medical history on file.  No family history on file.  Past Surgical History:  Procedure Laterality Date  . CESAREAN SECTION     Social History   Occupational History   . Not on file  Tobacco Use  . Smoking status: Never Smoker  . Smokeless tobacco: Never Used  Substance and Sexual Activity  . Alcohol use: Never    Frequency: Never  . Drug use: Never  . Sexual activity: Yes

## 2019-02-14 ENCOUNTER — Ambulatory Visit: Payer: Worker's Compensation | Attending: Orthopaedic Surgery | Admitting: Occupational Therapy

## 2019-02-14 ENCOUNTER — Other Ambulatory Visit: Payer: Self-pay

## 2019-02-14 ENCOUNTER — Encounter: Payer: Self-pay | Admitting: Occupational Therapy

## 2019-02-14 DIAGNOSIS — M25522 Pain in left elbow: Secondary | ICD-10-CM | POA: Diagnosis not present

## 2019-02-14 DIAGNOSIS — M6281 Muscle weakness (generalized): Secondary | ICD-10-CM

## 2019-02-14 DIAGNOSIS — M25622 Stiffness of left elbow, not elsewhere classified: Secondary | ICD-10-CM | POA: Insufficient documentation

## 2019-02-14 NOTE — Therapy (Signed)
[facility] [address] [place], [place], [address] Phone: [phone]   Fax:  [phone]  Occupational Therapy Treatment  Patient Details  Name: Sheila Holden MRN: [id number] Date of Birth: 08/31/1975 Referring Provider (OT): Dr. Roda ShuttersXu   Encounter Date: 02/14/2019  OT End of Session - 02/14/19 0814    Visit Number  2    Number of Visits  25    Date for OT Re-Evaluation  05/06/19    Authorization Type  Workers Comp approved for 16 visit along with case re-eval    Authorization - Visit Number  2    Authorization - Number of Visits  16    OT Start Time  [phone]    OT Stop Time  0855    OT Time Calculation (min)  43 min    Activity Tolerance  Patient tolerated treatment well    Behavior During Therapy  [facility] for tasks assessed/performed       History reviewed. No pertinent past medical history.  Past Surgical History:  Procedure Laterality Date  . CESAREAN SECTION      There were no vitals filed for this visit.  Subjective Assessment - 02/14/19 0814    Subjective   Pt reports pain in LUE    Patient Stated Goals  regain use of LUE    Currently in Pain?  Yes    Pain Score  5     Pain Location  Arm    Pain Orientation  Left    Pain Descriptors / Indicators  Aching    Pain Type  Acute pain    Pain Onset  1 to 4 weeks ago    Pain Frequency  Intermittent    Aggravating Factors   movement    Pain Relieving Factors  rest         [facility] OT Assessment - 02/14/19 0001      AROM   Overall AROM Comments  LUE elbow flexion: 125*, extension -18*, pronation 75*, supination 95* shoulder flexion 140*        Hot pack x8 min to L elbow for pain/stiffness with no adverse reactions.  AAROM elbow flex/ext, shoulder flex and abduction with cane x10 each with min cueing.              OT Education - 02/14/19 0836    Education Details  Reviewed AROM HEP--min v.c. for head compensation with shoulder movement and  compensation supination/pronation    Person(s) Educated  Patient    Methods  Explanation;Demonstration;Verbal cues    Comprehension  Verbalized understanding;Returned demonstration;Verbal cues required       OT Short Term Goals - 02/14/19 0830      OT SHORT TERM GOAL #1   Title  I with HEP.    Time  6    Period  Weeks    Status  New    Target Date  03/22/19      OT SHORT TERM GOAL #2   Title  Pt will increase elbow flexion to 120  for increased functional use    Baseline  100    Time  6    Period  Weeks    Status  Achieved   02/14/19:  125*     OT SHORT TERM GOAL #3   Title  Pt will demonxtrate elbow ext to at least -15 in prep for functional reach.    Time  6    Period  Weeks    Status  New  OT SHORT TERM GOAL #4   Title  Pt will demonstrate forearm pronation to 85 for increased functional use.    Time  4    Period  Weeks    Status  New      OT SHORT TERM GOAL #5   Title  Pt will report pain no greater than 4/10 with perfromance of HEP.    Time  4    Period  Weeks    Status  New      OT SHORT TERM GOAL #6   Title  Pt will perform all basic ADLs modified indpendently using LUE as a non dominant assist.    Time  6    Period  Weeks    Status  New        OT Long Term Goals - 02/05/19 1241      OT LONG TERM GOAL #1   Title  I updated with HEP.    Time  12    Period  Weeks    Status  New      OT LONG TERM GOAL #2   Title  Pt will demonstrate elbow flexion/ extension WNLS for ADLs/ work activities.    Time  12    Period  Weeks    Status  New      OT LONG TERM GOAL #3   Title  Pt will demonstrate adequate LUE strength to retrieve a 3 lbs object from overhead shelf with LUE with pain no greater than 2/10.    Time  12    Period  Weeks    Status  New      OT LONG TERM GOAL #4   Title  Pt will resume performance of home management and cooking activities modified independently using LUE as a non dominant assist.    Time  12    Period  Weeks    Status   New      OT LONG TERM GOAL #5   Title  Pt will perfrom simulated work activities modified indpendently with pain no greater than 2/10,    Time  12    Period  Weeks    Status  New            Plan - 02/14/19 0815    Clinical Impression Statement  Pt is progressing towards goals with improving AROM this week.    OT Occupational Profile and History  Problem Focused Assessment - Including review of records relating to presenting problem    Occupational performance deficits (Please refer to evaluation for details):  ADL's;IADL's;Rest and Sleep;Work;Leisure;Social Participation    Body Structure / Function / Physical Skills  ADL;UE functional use;Pain;Flexibility;FMC;ROM;Gait;Coordination;GMC;Decreased knowledge of precautions;IADL;Strength;Dexterity    Rehab Potential  Good    Clinical Decision Making  Limited treatment options, no task modification necessary    Comorbidities Affecting Occupational Performance:  None    Modification or Assistance to Complete Evaluation   No modification of tasks or assist necessary to complete eval    OT Frequency  2x / week   plus eval   OT Duration  12 weeks    OT Treatment/Interventions  Self-care/ADL training;Ultrasound;Energy conservation;Aquatic Therapy;DME and/or AE instruction;Patient/family education;Balance training;Passive range of motion;Cryotherapy;Fluidtherapy;Splinting;Functional Mobility Training;Electrical Stimulation;Contrast Bath;Paraffin;Moist Heat;Therapeutic exercise;Manual Therapy;Therapeutic activities;Neuromuscular education    Plan  continue gentle A/ROM    OT Home Exercise Plan  issued gentle elbow, forearm and shoulder ROM       Patient will benefit from skilled therapeutic intervention in order to improve  the following deficits and impairments:   Body Structure / Function / Physical Skills: ADL, UE functional use, Pain, Flexibility, FMC, ROM, Gait, Coordination, GMC, Decreased knowledge of precautions, IADL, Strength,  Dexterity       Visit Diagnosis: Stiffness of left elbow, not elsewhere classified  Pain in left elbow  Muscle weakness (generalized)    Problem List There are no active problems to display for this patient.   [facility] 02/14/2019, 9:21 AM  [facility] [address] [place], [place], [address] Phone: [phone]   Fax:  [phone]  Name: Kanoelani Dobies MRN: [id number] Date of Birth: Jun 28, 1975   Vianne Bulls, OTR/L [facility] [address] [place],   [address] [phone] phone [phone] 02/14/19 9:21 AM

## 2019-02-17 ENCOUNTER — Telehealth: Payer: Self-pay | Admitting: Orthopaedic Surgery

## 2019-02-17 NOTE — Telephone Encounter (Signed)
Patient called stating her PT was delayed and patient is requesting more therapy before returning to work. Patient has only has 2 sessions. Wants to be able to do therapy until her return date (?)  Please call patient to advise [phone]

## 2019-02-17 NOTE — Telephone Encounter (Signed)
Please advise. Thanks.  

## 2019-02-18 ENCOUNTER — Other Ambulatory Visit: Payer: Self-pay

## 2019-02-18 ENCOUNTER — Ambulatory Visit: Payer: Worker's Compensation | Admitting: Occupational Therapy

## 2019-02-18 ENCOUNTER — Telehealth: Payer: Self-pay | Admitting: Orthopaedic Surgery

## 2019-02-18 ENCOUNTER — Encounter: Payer: Self-pay | Admitting: Occupational Therapy

## 2019-02-18 DIAGNOSIS — M25522 Pain in left elbow: Secondary | ICD-10-CM

## 2019-02-18 DIAGNOSIS — M25622 Stiffness of left elbow, not elsewhere classified: Secondary | ICD-10-CM

## 2019-02-18 DIAGNOSIS — M6281 Muscle weakness (generalized): Secondary | ICD-10-CM | POA: Diagnosis not present

## 2019-02-18 NOTE — Telephone Encounter (Signed)
Is this ok?

## 2019-02-18 NOTE — Telephone Encounter (Signed)
Patient aware note at front desk  

## 2019-02-18 NOTE — Patient Instructions (Signed)
  ROM: Abduction - Wand   Holding wand with left hand palm up, push wand directly out to side, leading with other hand palm down, until stretch is felt. Hold 3 seconds. Repeat 10 times per set. Do 2 sessions per day. (Lying down)     [employer] - Standing   With arms straight, hold cane forward at waist. Raise cane above head. Hold 3 seconds. Repeat 10 times. Do 2 times per day.     SELF ASSISTED WITH OBJECT: Elbow Flexion / Extension    Hold cane with both hands. Bend and straighten elbows. 10 reps per set, 1 sets per day

## 2019-02-18 NOTE — Therapy (Signed)
[facility] [address] [place], [place], [address] Phone: [phone]   Fax:  [phone]  Occupational Therapy Treatment  Patient Details  Name: Isaias Sakaiishira Schippers MRN: [id number] Date of Birth: 1976-05-29 Referring Provider (OT): Dr. Roda ShuttersXu   Encounter Date: 02/18/2019  OT End of Session - 02/18/19 0745    Visit Number  3    Number of Visits  25    Date for OT Re-Evaluation  05/06/19    Authorization Type  [employer] approved for 16 visit along with case re-eval    Authorization - Visit Number  23    Authorization - Number of Visits  16    OT Start Time  0727   started late due to pt in restroom + hot pack (no charge)   OT Stop Time  0800    OT Time Calculation (min)  33 min    Activity Tolerance  Patient tolerated treatment well    Behavior During Therapy  [facility] for tasks assessed/performed       History reviewed. No pertinent past medical history.  Past Surgical History:  Procedure Laterality Date  . CESAREAN SECTION      There were no vitals filed for this visit.  Subjective Assessment - 02/18/19 0728    Subjective   it's up this am    Patient Stated Goals  regain use of LUE    Currently in Pain?  Yes    Pain Score  6     Pain Location  Elbow    Pain Orientation  Left    Pain Descriptors / Indicators  Aching    Pain Type  Acute pain    Pain Onset  1 to 4 weeks ago    Pain Frequency  Constant    Aggravating Factors   movement    Pain Relieving Factors  rest        Hot pack x696min to L elbow for pain/stiffness with no adverse reactions.  AROM:  Shoulder flex, elbow flex/ext, supination/pronation with min v.c.  Arm bike x5 min level 1 for reciprocal movement, AROM/AAROM      OT Education - 02/18/19 0740    Education Details  Cane Exercises for gentle AAROM--see pt instructions    Person(s) Educated  Patient    Methods  Explanation;Demonstration;Verbal cues;Handout    Comprehension   Verbalized understanding;Returned demonstration       OT Short Term Goals - 02/14/19 0830      OT SHORT TERM GOAL #1   Title  I with HEP.    Time  6    Period  Weeks    Status  New    Target Date  03/22/19      OT SHORT TERM GOAL #2   Title  Pt will increase elbow flexion to 120  for increased functional use    Baseline  100    Time  6    Period  Weeks    Status  Achieved   02/14/19:  125*     OT SHORT TERM GOAL #3   Title  Pt will demonxtrate elbow ext to at least -15 in prep for functional reach.    Time  6    Period  Weeks    Status  New      OT SHORT TERM GOAL #4   Title  Pt will demonstrate forearm pronation to 85 for increased functional use.    Time  4    Period  Weeks  Status  New      OT SHORT TERM GOAL #5   Title  Pt will report pain no greater than 4/10 with perfromance of HEP.    Time  4    Period  Weeks    Status  New      OT SHORT TERM GOAL #6   Title  Pt will perform all basic ADLs modified indpendently using LUE as a non dominant assist.    Time  6    Period  Weeks    Status  New        OT Long Term Goals - 02/05/19 1241      OT LONG TERM GOAL #1   Title  I updated with HEP.    Time  12    Period  Weeks    Status  New      OT LONG TERM GOAL #2   Title  Pt will demonstrate elbow flexion/ extension WNLS for ADLs/ work activities.    Time  12    Period  Weeks    Status  New      OT LONG TERM GOAL #3   Title  Pt will demonstrate adequate LUE strength to retrieve a 3 lbs object from overhead shelf with LUE with pain no greater than 2/10.    Time  12    Period  Weeks    Status  New      OT LONG TERM GOAL #4   Title  Pt will resume performance of home management and cooking activities modified independently using LUE as a non dominant assist.    Time  12    Period  Weeks    Status  New      OT LONG TERM GOAL #5   Title  Pt will perfrom simulated work activities modified indpendently with pain no greater than 2/10,    Time  12     Period  Weeks    Status  New            Plan - 02/18/19 0729    Clinical Impression Statement  Pt continues to make progress towards goals, but reports continued moderate pain.    OT Occupational Profile and History  Problem Focused Assessment - Including review of records relating to presenting problem    Occupational performance deficits (Please refer to evaluation for details):  ADL's;IADL's;Rest and Sleep;Work;Leisure;Social Participation    Body Structure / Function / Physical Skills  ADL;UE functional use;Pain;Flexibility;FMC;ROM;Gait;Coordination;GMC;Decreased knowledge of precautions;IADL;Strength;Dexterity    Rehab Potential  Good    Clinical Decision Making  Limited treatment options, no task modification necessary    Comorbidities Affecting Occupational Performance:  None    Modification or Assistance to Complete Evaluation   No modification of tasks or assist necessary to complete eval    OT Frequency  2x / week   plus eval   OT Duration  12 weeks    OT Treatment/Interventions  Self-care/ADL training;Ultrasound;Energy conservation;Aquatic Therapy;DME and/or AE instruction;Patient/family education;Balance training;Passive range of motion;Cryotherapy;Fluidtherapy;Splinting;Functional Mobility Training;Electrical Stimulation;Contrast Bath;Paraffin;Moist Heat;Therapeutic exercise;Manual Therapy;Therapeutic activities;Neuromuscular education    Plan  continue gentle A/ROM    OT Home Exercise Plan  issued gentle elbow, forearm and shoulder ROM       Patient will benefit from skilled therapeutic intervention in order to improve the following deficits and impairments:   Body Structure / Function / Physical Skills: ADL, UE functional use, Pain, Flexibility, FMC, ROM, Gait, Coordination, GMC, Decreased knowledge of precautions, IADL, Strength, Dexterity  Visit Diagnosis: Stiffness of left elbow, not elsewhere classified  Pain in left elbow  Muscle weakness  (generalized)    Problem List There are no active problems to display for this patient.   [facility] 02/18/2019, 7:48 AM  [facility] [address] [place], [place], [address] Phone: [phone]   Fax:  [phone]  Name: Vern Moo MRN: [id number] Date of Birth: [birth year]   Willa Frater, OTR/L [facility] [address] [place], [place]  [address] [phone] phone [phone] 02/18/19 7:48 AM

## 2019-02-18 NOTE — Telephone Encounter (Signed)
Can we get more therapy for patient?

## 2019-02-18 NOTE — Telephone Encounter (Signed)
Patient left a voicemail message requesting a out of work note for 2 more weeks.  Patient stated that her arm is still in a lot of pain and this would give her time to finish her PT.  CB#[phone].  Thank you.

## 2019-02-18 NOTE — Telephone Encounter (Signed)
Ok to do

## 2019-02-18 NOTE — Telephone Encounter (Signed)
yes

## 2019-02-19 DIAGNOSIS — N911 Secondary amenorrhea: Secondary | ICD-10-CM | POA: Diagnosis not present

## 2019-02-19 DIAGNOSIS — Z6830 Body mass index (BMI) 30.0-30.9, adult: Secondary | ICD-10-CM | POA: Diagnosis not present

## 2019-02-20 ENCOUNTER — Ambulatory Visit: Payer: Worker's Compensation | Admitting: Occupational Therapy

## 2019-02-26 ENCOUNTER — Ambulatory Visit: Payer: Worker's Compensation | Attending: Orthopaedic Surgery | Admitting: Occupational Therapy

## 2019-02-26 ENCOUNTER — Other Ambulatory Visit: Payer: Self-pay

## 2019-02-26 DIAGNOSIS — M25622 Stiffness of left elbow, not elsewhere classified: Secondary | ICD-10-CM | POA: Insufficient documentation

## 2019-02-26 DIAGNOSIS — M6281 Muscle weakness (generalized): Secondary | ICD-10-CM | POA: Diagnosis not present

## 2019-02-26 DIAGNOSIS — M25522 Pain in left elbow: Secondary | ICD-10-CM | POA: Diagnosis not present

## 2019-02-26 NOTE — Therapy (Signed)
[facility] [address] [place], [place], [address] Phone: [phone]   Fax:  [phone]  Occupational Therapy Treatment  Patient Details  Name: Sheila Holden MRN: [id number] Date of Birth: 06/28/1975 Referring Provider (OT): Dr. Roda ShuttersXu   Encounter Date: 02/26/2019  OT End of Session - 02/26/19 0833    Visit Number  4    Number of Visits  25    Date for OT Re-Evaluation  05/06/19    Authorization Type  Work[employer] Comp approved for 16 visit along with case re-eval    Authorization - Visit Number  24    Authorization - Number of Visits  16    OT Start Time  0810    OT Stop Time  0838   Pt 10 min late   OT Time Calculation (min)  28 min    Activity Tolerance  Patient tolerated treatment well    Behavior During Therapy  [facility] for tasks assessed/performed       No past medical history on file.  Past Surgical History:  Procedure Laterality Date  . CESAREAN SECTION      There were no vitals filed for this visit.  Subjective Assessment - 02/26/19 0811    Subjective   I see the MD on 01/04/19    Pertinent History  radial neck fx 01/23/19 - no surgery    Currently in Pain?  Yes    Pain Score  4     Pain Location  Elbow    Pain Orientation  Left    Pain Descriptors / Indicators  Aching    Pain Type  Acute pain    Pain Onset  1 to 4 weeks ago    Pain Frequency  Constant    Aggravating Factors   movement    Pain Relieving Factors  rest        A/ROM in elbow flex, ext, supination and pronation x 10 reps Forearm gym x 4 reps UBE x 8 min. Level 1 for elbow ROM Elbow flex = 122-125*, ext = WFL's, sup/pron = WFL's  Pt politely declined ice at end of session                    OT Short Term Goals - 02/14/19 0830      OT SHORT TERM GOAL #1   Title  I with HEP.    Time  6    Period  Weeks    Status  New    Target Date  03/22/19      OT SHORT TERM GOAL #2   Title  Pt will increase elbow flexion  to 120  for increased functional use    Baseline  100    Time  6    Period  Weeks    Status  Achieved   02/14/19:  125*     OT SHORT TERM GOAL #3   Title  Pt will demonxtrate elbow ext to at least -15 in prep for functional reach.    Time  6    Period  Weeks    Status  New      OT SHORT TERM GOAL #4   Title  Pt will demonstrate forearm pronation to 85 for increased functional use.    Time  4    Period  Weeks    Status  New      OT SHORT TERM GOAL #5   Title  Pt will report pain no greater than  4/10 with perfromance of HEP.    Time  4    Period  Weeks    Status  New      OT SHORT TERM GOAL #6   Title  Pt will perform all basic ADLs modified indpendently using LUE as a non dominant assist.    Time  6    Period  Weeks    Status  New        OT Long Term Goals - 02/05/19 1241      OT LONG TERM GOAL #1   Title  I updated with HEP.    Time  12    Period  Weeks    Status  New      OT LONG TERM GOAL #2   Title  Pt will demonstrate elbow flexion/ extension WNLS for ADLs/ work activities.    Time  12    Period  Weeks    Status  New      OT LONG TERM GOAL #3   Title  Pt will demonstrate adequate LUE strength to retrieve a 3 lbs object from overhead shelf with LUE with pain no greater than 2/10.    Time  12    Period  Weeks    Status  New      OT LONG TERM GOAL #4   Title  Pt will resume performance of home management and cooking activities modified independently using LUE as a non dominant assist.    Time  12    Period  Weeks    Status  New      OT LONG TERM GOAL #5   Title  Pt will perfrom simulated work activities modified indpendently with pain no greater than 2/10,    Time  12    Period  Weeks    Status  New            Plan - 02/26/19 0938    Clinical Impression Statement  Pt progressing with ROM but appears hesistant to use LUE    Occupational performance deficits (Please refer to evaluation for details):  ADL's;IADL's;Rest and  Sleep;Work;Leisure;Social Participation    Body Structure / Function / Physical Skills  ADL;UE functional use;Pain;Flexibility;FMC;ROM;Gait;Coordination;GMC;Decreased knowledge of precautions;IADL;Strength;Dexterity    Rehab Potential  Good    OT Frequency  2x / week    OT Duration  12 weeks    OT Treatment/Interventions  Self-care/ADL training;Ultrasound;Energy conservation;Aquatic Therapy;DME and/or AE instruction;Patient/family education;Balance training;Passive range of motion;Cryotherapy;Fluidtherapy;Splinting;Functional Mobility Training;Electrical Stimulation;Contrast Bath;Paraffin;Moist Heat;Therapeutic exercise;Manual Therapy;Therapeutic activities;Neuromuscular education    Plan  continue gentle A/ROM, write note to MD re: progression of therapy (P/ROM and strengthening) as pt sees MD on 03/07/19    Consulted and Agree with Plan of Care  Patient       Patient will benefit from skilled therapeutic intervention in order to improve the following deficits and impairments:   Body Structure / Function / Physical Skills: ADL, UE functional use, Pain, Flexibility, FMC, ROM, Gait, Coordination, GMC, Decreased knowledge of precautions, IADL, Strength, Dexterity       Visit Diagnosis: Pain in left elbow  Stiffness of left elbow, not elsewhere classified    Problem List There are no active problems to display for this patient.   Carey Bullocks, OTR/L 02/26/2019, 8:43 AM  [facility] [address] [place], [place], [address] Phone: [phone]   Fax:  [phone]  Name: Sheila Holden MRN: [id number] Date of Birth: Sep 14, 1975

## 2019-02-27 DIAGNOSIS — Z130 Encounter for screening for diseases of the blood and blood-forming organs and certain disorders involving the immune mechanism: Secondary | ICD-10-CM | POA: Diagnosis not present

## 2019-02-27 DIAGNOSIS — Z01419 Encounter for gynecological examination (general) (routine) without abnormal findings: Secondary | ICD-10-CM | POA: Diagnosis not present

## 2019-02-27 DIAGNOSIS — Z1231 Encounter for screening mammogram for malignant neoplasm of breast: Secondary | ICD-10-CM | POA: Diagnosis not present

## 2019-02-27 DIAGNOSIS — Z13220 Encounter for screening for lipoid disorders: Secondary | ICD-10-CM | POA: Diagnosis not present

## 2019-02-27 DIAGNOSIS — Z0000 Encounter for general adult medical examination without abnormal findings: Secondary | ICD-10-CM | POA: Diagnosis not present

## 2019-02-27 DIAGNOSIS — Z6832 Body mass index (BMI) 32.0-32.9, adult: Secondary | ICD-10-CM | POA: Diagnosis not present

## 2019-02-27 DIAGNOSIS — Z1389 Encounter for screening for other disorder: Secondary | ICD-10-CM | POA: Diagnosis not present

## 2019-02-27 DIAGNOSIS — Z1151 Encounter for screening for human papillomavirus (HPV): Secondary | ICD-10-CM | POA: Diagnosis not present

## 2019-03-04 ENCOUNTER — Ambulatory Visit: Payer: Worker's Compensation | Admitting: Occupational Therapy

## 2019-03-04 ENCOUNTER — Other Ambulatory Visit: Payer: Self-pay

## 2019-03-04 ENCOUNTER — Encounter: Payer: Self-pay | Admitting: Occupational Therapy

## 2019-03-04 DIAGNOSIS — M25622 Stiffness of left elbow, not elsewhere classified: Secondary | ICD-10-CM

## 2019-03-04 DIAGNOSIS — M6281 Muscle weakness (generalized): Secondary | ICD-10-CM

## 2019-03-04 DIAGNOSIS — M25522 Pain in left elbow: Secondary | ICD-10-CM | POA: Diagnosis not present

## 2019-03-04 NOTE — Patient Instructions (Addendum)
Dr. Erlinda Hong,  Sheila Holden is progressing well with AROM.    Her latest AROM  Elbow flexion 128*, elbow ext -23*, supination  110*, pronation 85*, shoulder flexion 165*  Please clarify current precautions:  -  Can she progress to PROM at this time?  -  Can we begin strengthening?  Please contact me with questions or concerns.   Thank you,  Vianne Bulls, OTR/L [facility] [address] [place], [place]  [address] [phone] phone [phone] 03/04/19 7:25 AM

## 2019-03-04 NOTE — Therapy (Signed)
[facility] [address] [place], [place], [address] Phone: [phone]   Fax:  [phone]  Occupational Therapy Treatment  Patient Details  Name: Sheila Holden MRN: [id number] Date of Birth: [birth year] Referring Provider (OT): Dr. Erlinda Hong   Encounter Date: 03/04/2019  OT End of Session - 03/04/19 0812    Visit Number  5    Number of Visits  25    Date for OT Re-Evaluation  05/06/19    Authorization Type  Work[employer] Comp approved for 16 visit along with case re-eval    Authorization - Visit Number  24    Authorization - Number of Visits  16    OT Start Time  0720    OT Stop Time  0801   hot pack x66min   OT Time Calculation (min)  41 min    Activity Tolerance  Patient tolerated treatment well    Behavior During Therapy  [facility] for tasks assessed/performed       History reviewed. No pertinent past medical history.  Past Surgical History:  Procedure Laterality Date  . CESAREAN SECTION      There were no vitals filed for this visit.  Subjective Assessment - 03/04/19 0731    Subjective   Pt reports that elbow is achy this morning    Pertinent History  radial neck fx 01/23/19 - no surgery    Pain Score  3     Pain Location  Elbow    Pain Orientation  Left    Pain Descriptors / Indicators  Aching    Pain Type  Acute pain    Pain Onset  1 to 4 weeks ago    Pain Frequency  Constant    Aggravating Factors   movement    Pain Relieving Factors  rest        Hot Pack x30min to L elbow with no adverse reactions for pain/stiffness.  AROM elbow flex/ext, supination/pronation x15 each.  Checked ROM--see goals section below.  Shoulder flex 165*, supination 110*.  Functional reaching to place clothespins on vertical pole for elbow ext/shoulder flex.  Forearm gym x6 with min v.c.   Arm bike x67min level 1/no resistance for ROM/conditioning     OT Education - 03/04/19 0744    Education Details  Note to MD regarding  clearance for strengthening and PROM    Person(s) Educated  Patient    Methods  Explanation;Handout    Comprehension  Verbalized understanding       OT Short Term Goals - 03/04/19 0732      OT SHORT TERM GOAL #1   Title  I with HEP.    Time  6    Period  Weeks    Status  Achieved    Target Date  03/22/19      OT SHORT TERM GOAL #2   Title  Pt will increase elbow flexion to 120  for increased functional use    Baseline  100    Time  6    Period  Weeks    Status  Achieved   02/14/19:  125*, 03/04/19  128*     OT SHORT TERM GOAL #3   Title  Pt will demonxtrate elbow ext to at least -15 in prep for functional reach.    Time  6    Period  Weeks    Status  On-going   03/04/19  -23*     OT SHORT TERM GOAL #4   Title  Pt  will demonstrate forearm pronation to 85 for increased functional use.    Time  4    Period  Weeks    Status  Achieved   03/04/19 85*     OT SHORT TERM GOAL #5   Title  Pt will report pain no greater than 4/10 with perfromance of HEP.    Time  4    Period  Weeks    Status  Achieved   03/04/19  3-4/10     OT SHORT TERM GOAL #6   Title  Pt will perform all basic ADLs modified indpendently using LUE as a non dominant assist.    Time  6    Period  Weeks    Status  On-going        OT Long Term Goals - 02/05/19 1241      OT LONG TERM GOAL #1   Title  I updated with HEP.    Time  12    Period  Weeks    Status  New      OT LONG TERM GOAL #2   Title  Pt will demonstrate elbow flexion/ extension WNLS for ADLs/ work activities.    Time  12    Period  Weeks    Status  New      OT LONG TERM GOAL #3   Title  Pt will demonstrate adequate LUE strength to retrieve a 3 lbs object from overhead shelf with LUE with pain no greater than 2/10.    Time  12    Period  Weeks    Status  New      OT LONG TERM GOAL #4   Title  Pt will resume performance of home management and cooking activities modified independently using LUE as a non dominant assist.    Time  12     Period  Weeks    Status  New      OT LONG TERM GOAL #5   Title  Pt will perfrom simulated work activities modified indpendently with pain no greater than 2/10,    Time  12    Period  Weeks    Status  New            Plan - 03/04/19 [id number]    Clinical Impression Statement  Pt is progressing with ROM and LUE functional use.    Occupational performance deficits (Please refer to evaluation for details):  ADL's;IADL's;Rest and Sleep;Work;Leisure;Social Participation    Body Structure / Function / Physical Skills  ADL;UE functional use;Pain;Flexibility;FMC;ROM;Gait;Coordination;GMC;Decreased knowledge of precautions;IADL;Strength;Dexterity    Rehab Potential  Good    OT Frequency  2x / week    OT Duration  12 weeks    OT Treatment/Interventions  Self-care/ADL training;Ultrasound;Energy conservation;Aquatic Therapy;DME and/or AE instruction;Patient/family education;Balance training;Passive range of motion;Cryotherapy;Fluidtherapy;Splinting;Functional Mobility Training;Electrical Stimulation;Contrast Bath;Paraffin;Moist Heat;Therapeutic exercise;Manual Therapy;Therapeutic activities;Neuromuscular education    Plan  note sent with pt for MD re: clearance for PROM and strengthening.  Progress per MD    Consulted and Agree with Plan of Care  Patient       Patient will benefit from skilled therapeutic intervention in order to improve the following deficits and impairments:   Body Structure / Function / Physical Skills: ADL, UE functional use, Pain, Flexibility, FMC, ROM, Gait, Coordination, GMC, Decreased knowledge of precautions, IADL, Strength, Dexterity       Visit Diagnosis: Stiffness of left elbow, not elsewhere classified  Pain in left elbow  Muscle weakness (generalized)    Problem List There  are no active problems to display for this patient.   [facility] 03/04/2019, 8:21 AM  [facility] [address] [place], [place], [address] Phone: [phone]   Fax:  [phone]  Name: Sheila Holden MRN: [id number] Date of Birth: Jul 02, 1975   Willa Frater, OTR/L [facility] [address] [place], [place]  [address] [phone] phone [phone] 03/04/19 8:21 AM

## 2019-03-04 NOTE — Patient Instructions (Addendum)
   Dr. Erlinda Hong,  Letta Kocher is progressing well with AROM.    Her latest AROM  Elbow flexion, elbow ext, supination, pronation, shoulder flexion  Please clarify current precautions:  -  Can she begin PROM at this time?  -  Can we begin strengthening?  Please contact me with questions or concerns.   Thank you,  Vianne Bulls, OTR/L [facility] [address] [place], [place]  [address] [phone] phone [phone] 03/04/19 7:25 AM

## 2019-03-05 ENCOUNTER — Telehealth: Payer: Self-pay | Admitting: Orthopaedic Surgery

## 2019-03-05 NOTE — Telephone Encounter (Signed)
Patient called stated that [employer] has her form and they are going to complete it tomorrow. She asking if we could complete form today. I advised patient we are unable to complete as we have an afternoon full of providers seeing patients and no clinical staff available to complete the forms. Told her that I would get the forms signed as soon as I get them from [employer].

## 2019-03-07 ENCOUNTER — Other Ambulatory Visit: Payer: Self-pay

## 2019-03-07 ENCOUNTER — Encounter: Payer: Self-pay | Admitting: Orthopaedic Surgery

## 2019-03-07 ENCOUNTER — Ambulatory Visit (INDEPENDENT_AMBULATORY_CARE_PROVIDER_SITE_OTHER): Payer: Worker's Compensation | Admitting: Orthopaedic Surgery

## 2019-03-07 VITALS — Ht 65.0 in | Wt 173.0 lb

## 2019-03-07 DIAGNOSIS — S52135A Nondisplaced fracture of neck of left radius, initial encounter for closed fracture: Secondary | ICD-10-CM

## 2019-03-07 NOTE — Progress Notes (Signed)
      Patient: Sheila Holden           Date of Birth: 1975-10-21           MRN: [id number] Visit Date: 03/07/2019 PCP: Patient, No Pcp Per   Assessment & Plan:  Chief Complaint:  Chief Complaint  Patient presents with  . Left Elbow - Follow-up   Visit Diagnoses:  1. Closed nondisplaced fracture of neck of left radius, initial encounter     Plan: Patient is a pleasant 43 year old female who presents our clinic today 6 weeks status post left nondisplaced radial neck fracture, date of injury 01/23/2019.  She has been doing well.  She has no pain.  She has been in physical therapy and has regained near full range of motion.  She has not returned to work as she has a very physical job working for the [employer].  Examination of her left elbow reveals no tenderness to the fracture site.  she has full extension.  She has near full flexion.  Near full strength.  She is neurovascular intact distally.  At this point, she will continue with physical therapy where they will start with strengthening exercises.  We will keep her out of work for another 2 weeks.  Follow-up with [place] in 2 weeks time for likely release back to work full duty.  Call with concerns or questions.  Follow-Up Instructions: Return in about 2 weeks (around 03/21/2019).   Orders:  No orders of the defined types were placed in this encounter.  No orders of the defined types were placed in this encounter.   Imaging: No new imaging  PMFS History: There are no active problems to display for this patient.  History reviewed. No pertinent past medical history.  History reviewed. No pertinent family history.  Past Surgical History:  Procedure Laterality Date  . CESAREAN SECTION     Social History   Occupational History  . Not on file  Tobacco Use  . Smoking status: Never Smoker  . Smokeless tobacco: Never Used  Substance and Sexual Activity  . Alcohol use: Never    Frequency: Never  . Drug use: Never  .  Sexual activity: Yes

## 2019-03-12 ENCOUNTER — Other Ambulatory Visit: Payer: Self-pay

## 2019-03-12 ENCOUNTER — Encounter: Payer: Self-pay | Admitting: Occupational Therapy

## 2019-03-12 ENCOUNTER — Telehealth: Payer: Self-pay | Admitting: Orthopaedic Surgery

## 2019-03-12 ENCOUNTER — Ambulatory Visit: Payer: Worker's Compensation | Admitting: Occupational Therapy

## 2019-03-12 DIAGNOSIS — M25522 Pain in left elbow: Secondary | ICD-10-CM | POA: Diagnosis not present

## 2019-03-12 DIAGNOSIS — M25622 Stiffness of left elbow, not elsewhere classified: Secondary | ICD-10-CM

## 2019-03-12 DIAGNOSIS — M6281 Muscle weakness (generalized): Secondary | ICD-10-CM | POA: Diagnosis not present

## 2019-03-12 NOTE — Patient Instructions (Signed)
    Elbow: Flexion   Use other hand to bend elbow with palm toward the same shoulder. For a stretch Hold _10___ seconds. Repeat _10___ times. Do _2-3___ sessions per day. CAUTION: Movement should be gentle, steady and slow.  Elbow: Extension   Place thick telephone book or 2-3 folded/rolled towels on table and rest upper arm on it. Push forearm with other hand and use a steady downward and outward pull to straighten elbow. Hold 10 seconds. Repeat 10 times. Do 2 sessions per day. CAUTION: Stretch slowly and gently.    Flexion (Resistive)    Hold 1lb weight in hand and bend elbow, keeping wrist straight. Support elbow with folded towel on table, or hold close to body. Hold 3 seconds. Repeat 10-15 times. Do 2 sessions per day.  Copyright  VHI. All rights reserved.     Extension (Resistive)    Hold can/water bottle or 1lb weight. Point elbow up and out, and straighten arm without moving shoulder. Hold 3 seconds. Lower slowly by bending elbow. Can do this lying down. Repeat 10-15 times. Do 2 sessions per day.  Copyright  VHI. All rights reserved.     Pronation / Supination (Resistive)    Hold hammer  and rotate palm up and down. Keep elbow flexed at side and wrist straight.  Hold 5-10sec.  (start by holding in middle of handle then work your way to the end for bigger stretch) Repeat 10-15 times. Do 2 sessions per day.  Copyright  VHI. All rights reserved.

## 2019-03-12 NOTE — Telephone Encounter (Signed)
[place] left a [employer] stating that they could not read the approved to date.  CB#[phone].  Thank you.

## 2019-03-12 NOTE — Therapy (Signed)
[facility] [address] [place], [place], [address] Phone: [phone]   Fax:  [phone]  Occupational Therapy Treatment  Patient Details  Name: Sheila Holden MRN: [id number] Date of Birth: 1976/01/06 Referring Provider (OT): Dr. Roda Shutters   Encounter Date: 03/12/2019  OT End of Session - 03/12/19 0722    Visit Number  6    Number of Visits  25    Date for OT Re-Evaluation  05/06/19    Authorization Type  Workers Comp approved for 16 visit along with case re-eval    Authorization - Visit Number  24    Authorization - Number of Visits  16    OT Start Time  0719    OT Stop Time  0800   6 min hot pack no charge   OT Time Calculation (min)  41 min    Activity Tolerance  Patient tolerated treatment well    Behavior During Therapy  [facility] for tasks assessed/performed       History reviewed. No pertinent past medical history.  Past Surgical History:  Procedure Laterality Date  . CESAREAN SECTION      There were no vitals filed for this visit.  Subjective Assessment - 03/12/19 0722    Subjective   Pt reports that elbow is achy this morning    Pertinent History  radial neck fx 01/23/19 - no surgery    Limitations  MD note received 03/12/19 from appt last week clearing pt for PROM and strengthening    Currently in Pain?  Yes    Pain Score  2     Pain Location  Arm    Pain Descriptors / Indicators  Aching    Pain Type  Acute pain    Pain Onset  1 to 4 weeks ago    Pain Frequency  Constant    Aggravating Factors   movement    Pain Relieving Factors  rest           Hot Pack to L elbow x78min with no adverse reactions for pain/stiffness.  AROM elbow flex/ext x10.  Arm bike x49min level 3 for conditioning/ROM without rest, forward/backwards            OT Education - 03/12/19 0744    Education Details  Elbow PROM and strnegthening HEP--see pt instructions    Person(s) Educated  Patient    Methods   Explanation;Handout;Demonstration;Verbal cues    Comprehension  Verbalized understanding;Returned demonstration;Verbal cues required       OT Short Term Goals - 03/04/19 0732      OT SHORT TERM GOAL #1   Title  I with HEP.    Time  6    Period  Weeks    Status  Achieved    Target Date  03/22/19      OT SHORT TERM GOAL #2   Title  Pt will increase elbow flexion to 120  for increased functional use    Baseline  100    Time  6    Period  Weeks    Status  Achieved   02/14/19:  125*, 03/04/19  128*     OT SHORT TERM GOAL #3   Title  Pt will demonxtrate elbow ext to at least -15 in prep for functional reach.    Time  6    Period  Weeks    Status  On-going   03/04/19  -23*     OT SHORT TERM GOAL #4   Title  Pt will demonstrate forearm pronation to 85 for increased functional use.    Time  4    Period  Weeks    Status  Achieved   03/04/19 85*     OT SHORT TERM GOAL #5   Title  Pt will report pain no greater than 4/10 with perfromance of HEP.    Time  4    Period  Weeks    Status  Achieved   03/04/19  3-4/10     OT SHORT TERM GOAL #6   Title  Pt will perform all basic ADLs modified indpendently using LUE as a non dominant assist.    Time  6    Period  Weeks    Status  On-going        OT Long Term Goals - 02/05/19 1241      OT LONG TERM GOAL #1   Title  I updated with HEP.    Time  12    Period  Weeks    Status  New      OT LONG TERM GOAL #2   Title  Pt will demonstrate elbow flexion/ extension WNLS for ADLs/ work activities.    Time  12    Period  Weeks    Status  New      OT LONG TERM GOAL #3   Title  Pt will demonstrate adequate LUE strength to retrieve a 3 lbs object from overhead shelf with LUE with pain no greater than 2/10.    Time  12    Period  Weeks    Status  New      OT LONG TERM GOAL #4   Title  Pt will resume performance of home management and cooking activities modified independently using LUE as a non dominant assist.    Time  12    Period   Weeks    Status  New      OT LONG TERM GOAL #5   Title  Pt will perfrom simulated work activities modified indpendently with pain no greater than 2/10,    Time  12    Period  Weeks    Status  New            Plan - 03/12/19 0932    Clinical Impression Statement  Pt is progressing with ROM and LUE functional use.  Pt tolerated initiation of PROM and strengthening well today.    Occupational performance deficits (Please refer to evaluation for details):  ADL's;IADL's;Rest and Sleep;Work;Leisure;Social Participation    Body Structure / Function / Physical Skills  ADL;UE functional use;Pain;Flexibility;FMC;ROM;Gait;Coordination;GMC;Decreased knowledge of precautions;IADL;Strength;Dexterity    Rehab Potential  Good    OT Frequency  2x / week    OT Duration  12 weeks    OT Treatment/Interventions  Self-care/ADL training;Ultrasound;Energy conservation;Aquatic Therapy;DME and/or AE instruction;Patient/family education;Balance training;Passive range of motion;Cryotherapy;Fluidtherapy;Splinting;Functional Mobility Training;Electrical Stimulation;Contrast Bath;Paraffin;Moist Heat;Therapeutic exercise;Manual Therapy;Therapeutic activities;Neuromuscular education    Plan  continue with PROM and strengthening, progress as tolerated.  Next MD appt 03/21/19    Consulted and Agree with Plan of Care  Patient       Patient will benefit from skilled therapeutic intervention in order to improve the following deficits and impairments:   Body Structure / Function / Physical Skills: ADL, UE functional use, Pain, Flexibility, FMC, ROM, Gait, Coordination, GMC, Decreased knowledge of precautions, IADL, Strength, Dexterity       Visit Diagnosis: Stiffness of left elbow, not elsewhere classified  Pain in left elbow  Muscle  weakness (generalized)    Problem List There are no active problems to display for this patient.   [facility] Center[facility]REEMAN, 03/12/2019, 8:00 AM  [facility] [address] [place], [place], [address] Phone: [phone]   Fax:  [phone]  Name: Isaias Sakaiishira Holden MRN: [id number] Date of Birth: [birth year]   Willa FraterAngela , OTR/L [facility] [address] [place], [place]  [address] [phone] phone [phone] 03/12/19 8:00 AM

## 2019-03-12 NOTE — Telephone Encounter (Signed)
I tried calling, LM for her to call me back to further discuss specifically what was needed from [place].

## 2019-03-19 ENCOUNTER — Ambulatory Visit: Payer: Worker's Compensation | Admitting: Occupational Therapy

## 2019-03-19 ENCOUNTER — Other Ambulatory Visit: Payer: Self-pay

## 2019-03-19 DIAGNOSIS — M25522 Pain in left elbow: Secondary | ICD-10-CM | POA: Diagnosis not present

## 2019-03-19 DIAGNOSIS — M25622 Stiffness of left elbow, not elsewhere classified: Secondary | ICD-10-CM

## 2019-03-19 DIAGNOSIS — M6281 Muscle weakness (generalized): Secondary | ICD-10-CM

## 2019-03-19 NOTE — Therapy (Signed)
[facility] [address] [place], [place], [address] Phone: [phone]   Fax:  [phone]  Occupational Therapy Treatment  Patient Details  Name: Sheila Holden MRN: [id number] Date of Birth: 12-Jun-1976 Referring Provider (OT): Dr. Roda Shutters   Encounter Date: 03/19/2019  OT End of Session - 03/19/19 0726    Visit Number  7    Number of Visits  25    Date for OT Re-Evaluation  05/06/19    Authorization Type  Workers Comp approved for 16 visit along with case re-eval    Authorization - Visit Number  7    Authorization - Number of Visits  16    OT Start Time  0719    OT Stop Time  0758    OT Time Calculation (min)  39 min    Activity Tolerance  Patient tolerated treatment well    Behavior During Therapy  [facility] for tasks assessed/performed       No past medical history on file.  Past Surgical History:  Procedure Laterality Date  . CESAREAN SECTION      There were no vitals filed for this visit.  Subjective Assessment - 03/19/19 0724    Subjective   Pt denies pain and reports incr use of LUE for light home maintenance tasks such as sweeping, laundry, and dishes    Pertinent History  radial neck fx 01/23/19 - no surgery    Limitations  MD note received 03/12/19 from appt last week clearing pt for PROM and strengthening    Currently in Pain?  No/denies    Pain Onset  1 to 4 weeks ago         [facility] OT Assessment - 03/19/19 0001      AROM   Overall AROM   Deficits    AROM Assessment Site  Elbow    Right/Left Elbow  Right;Left    Right Elbow Flexion  134    Right Elbow Extension  -5    Left Elbow Flexion  140         AROM elbow flexion/extension, supination/pronation x10 each followed by self PROM elbow flex/ext and supination/pronation and radial/ulnar deviation with hammer.  Elbow flex/ext with 2lb weight x15.       OT Education - 03/19/19 0746    Education Details  Yellow theraband HEP--see pt  instructions    Person(s) Educated  Patient    Methods  Explanation;Demonstration;Verbal cues;Handout    Comprehension  Verbalized understanding;Returned demonstration;Verbal cues required       OT Short Term Goals - 03/19/19 0730      OT SHORT TERM GOAL #1   Title  I with HEP.    Time  6    Period  Weeks    Status  Achieved    Target Date  03/22/19      OT SHORT TERM GOAL #2   Title  Pt will increase elbow flexion to 120  for increased functional use    Baseline  100    Time  6    Period  Weeks    Status  Achieved   02/14/19:  125*, 03/04/19  128*     OT SHORT TERM GOAL #3   Title  Pt will demonxtrate elbow ext to at least -15 in prep for functional reach.    Time  6    Period  Weeks    Status  On-going   03/04/19  -23*     OT SHORT TERM GOAL #  4   Title  Pt will demonstrate forearm pronation to 85 for increased functional use.    Time  4    Period  Weeks    Status  Achieved   03/04/19 85*     OT SHORT TERM GOAL #5   Title  Pt will report pain no greater than 4/10 with perfromance of HEP.    Time  4    Period  Weeks    Status  Achieved   03/04/19  3-4/10     OT SHORT TERM GOAL #6   Title  Pt will perform all basic ADLs modified indpendently using LUE as a non dominant assist.    Time  6    Period  Weeks    Status  Achieved        OT Long Term Goals - 03/19/19 0730      OT LONG TERM GOAL #1   Title  I updated with HEP.    Time  12    Period  Weeks    Status  New      OT LONG TERM GOAL #2   Title  Pt will demonstrate elbow flexion/ extension WNLS for ADLs/ work activities.    Time  12    Period  Weeks    Status  New      OT LONG TERM GOAL #3   Title  Pt will demonstrate adequate LUE strength to retrieve a 3 lbs object from overhead shelf with LUE with pain no greater than 2/10.    Time  12    Period  Weeks    Status  New      OT LONG TERM GOAL #4   Title  Pt will resume performance of home management and cooking activities modified independently using  LUE as a non dominant assist.    Time  12    Period  Weeks    Status  New      OT LONG TERM GOAL #5   Title  Pt will perfrom simulated work activities modified indpendently with pain no greater than 2/10,    Time  12    Period  Weeks    Status  New            Plan - 03/19/19 8546    Clinical Impression Statement  Pt is progressing well with improving LUE functional use and ROM.  Pt also with decr pain.  Pt would benefit from additional occupational for progression of strengthening and endurance of LUE in prep for work tasks.    Occupational performance deficits (Please refer to evaluation for details):  ADL's;IADL's;Rest and Sleep;Work;Leisure;Social Participation    Body Structure / Function / Physical Skills  ADL;UE functional use;Pain;Flexibility;FMC;ROM;Gait;Coordination;GMC;Decreased knowledge of precautions;IADL;Strength;Dexterity    Rehab Potential  Good    OT Frequency  2x / week    OT Duration  12 weeks    OT Treatment/Interventions  Self-care/ADL training;Ultrasound;Energy conservation;Aquatic Therapy;DME and/or AE instruction;Patient/family education;Balance training;Passive range of motion;Cryotherapy;Fluidtherapy;Splinting;Functional Mobility Training;Electrical Stimulation;Contrast Bath;Paraffin;Moist Heat;Therapeutic exercise;Manual Therapy;Therapeutic activities;Neuromuscular education    Plan  continue with PROM and strengthening, progress as tolerated.  Next MD appt 03/21/19    Consulted and Agree with Plan of Care  Patient       Patient will benefit from skilled therapeutic intervention in order to improve the following deficits and impairments:   Body Structure / Function / Physical Skills: ADL, UE functional use, Pain, Flexibility, FMC, ROM, Gait, Coordination, GMC, Decreased knowledge of precautions, IADL, Strength,  Dexterity       Visit Diagnosis: Stiffness of left elbow, not elsewhere classified  Pain in left elbow  Muscle weakness  (generalized)    Problem List There are no active problems to display for this patient.   [facility] 03/19/2019, 7:47 AM  [facility] [address] [place], [place], [address] Phone: [phone]   Fax:  [phone]  Name: Ameriah Debates MRN: [id number] Date of Birth: 11-Sep-1975   Willa Frater, OTR/L [facility] [address] [place], [place]  [address] [phone] phone [phone] 03/19/19 7:47 AM

## 2019-03-19 NOTE — Patient Instructions (Signed)
  Strengthening: Resisted Flexion   Attach tube to door.  Hold tubing with one arm at side. Pull forward and up with elbow straight. Move shoulder through pain-free range of motion, no further than shoulder height. Repeat 15 times per set.  Do 1-2 sessions per day.    Strengthening: Resisted Extension   Attach one end to door.  Hold tubing in one hand, arm forward. Pull arm back, elbow straight. Repeat 15 times per set. Do 1-2 sessions per day.   Resisted Horizontal Abduction: Bilateral   Sit or stand, tubing in both hands, palms down and arms out in front. Keeping arms straight, pinch shoulder blades together and stretch arms out. Repeat 15 times per set.  Do 1-2 sessions per day.   Elbow Flexion: Resisted   Hold tubing wrapped around  One hand and and other end secured under foot, curl arm up as far as possible keeping elbow down by your side. Repeat 15 times per set.  Do 1-2 sessions per day.     Triceps Extension (Frontal)    One arm forward at chest height and bent to 90, end of band in hand, other end secured on same side shoulder by other hand, extend arm slowly. Hold 2seconds. Repeat 15 times, alternating arms. Do 1-2 sessions per day.      

## 2019-03-21 ENCOUNTER — Other Ambulatory Visit: Payer: Self-pay

## 2019-03-21 ENCOUNTER — Ambulatory Visit (INDEPENDENT_AMBULATORY_CARE_PROVIDER_SITE_OTHER): Payer: No Typology Code available for payment source | Admitting: Orthopaedic Surgery

## 2019-03-21 ENCOUNTER — Encounter: Payer: Self-pay | Admitting: Orthopaedic Surgery

## 2019-03-21 DIAGNOSIS — S52135A Nondisplaced fracture of neck of left radius, initial encounter for closed fracture: Secondary | ICD-10-CM

## 2019-03-21 NOTE — Progress Notes (Signed)
   Office Visit Note   Patient: Sheila Holden           Date of Birth: 11-22-1975           MRN: [id number] Visit Date: 03/21/2019              Requested by: No referring provider defined for this encounter. PCP: Patient, No Pcp Per   Assessment & Plan: Visit Diagnoses:  1. Closed nondisplaced fracture of neck of left radius, initial encounter     Plan: Impression is healed nondisplaced radial neck fracture, left elbow.  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  She will follow-up with [place] as needed.  Follow-Up Instructions: Return if symptoms worsen or fail to improve.   Orders:  No orders of the defined types were placed in this encounter.  No orders of the defined types were placed in this encounter.     Procedures: No procedures performed   Clinical Data: No additional findings.   Subjective: Chief Complaint  Patient presents with  . Left Elbow - Pain    HPI patient is a pleasant 43 year old female who presents our clinic today weeks status post left elbow nondisplaced radial neck fracture, date of injury 01/23/2019.  She has been doing well.  No pain.  She has been in physical therapy once a week where she is regained near full range of motion and strength.  At this point, she is ready to return to work.  Review of Systems as detailed in HPI.  All others reviewed and are negative.   Objective: Vital Signs: There were no vitals taken for this visit.  Physical Exam well-developed and well-nourished female no acute distress.  Alert and oriented x3.  Ortho Exam examination of her left elbow reveals near full active range of motion all planes.  She lacks about 5 degrees of flexion.  Full strength.  She is neurovascular intact distally.  Specialty Comments:  No specialty comments available.  Imaging: No new imaging   PMFS History: There are no active problems to display for this  patient.  History reviewed. No pertinent past medical history.  History reviewed. No pertinent family history.  Past Surgical History:  Procedure Laterality Date  . CESAREAN SECTION     Social History   Occupational History  . Not on file  Tobacco Use  . Smoking status: Never Smoker  . Smokeless tobacco: Never Used  Substance and Sexual Activity  . Alcohol use: Never    Frequency: Never  . Drug use: Never  . Sexual activity: Yes

## 2019-03-26 ENCOUNTER — Ambulatory Visit: Payer: Worker's Compensation | Admitting: Occupational Therapy

## 2019-03-28 ENCOUNTER — Telehealth: Payer: Self-pay | Admitting: Physician Assistant

## 2019-03-28 ENCOUNTER — Telehealth: Payer: Self-pay | Admitting: Orthopaedic Surgery

## 2019-03-28 NOTE — Telephone Encounter (Signed)
See message.

## 2019-03-28 NOTE — Telephone Encounter (Signed)
Patient called. 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

## 2019-03-28 NOTE — Telephone Encounter (Signed)
What restrictions? And for how long?

## 2019-03-28 NOTE — Telephone Encounter (Signed)
4 weeks no restrictions unless she tells [place] otherwise

## 2019-03-28 NOTE — Telephone Encounter (Signed)
Ok to do

## 2019-03-28 NOTE — Telephone Encounter (Signed)
See message below °

## 2019-03-28 NOTE — Telephone Encounter (Signed)
FYI.. Patient needs note that states that she can do the original job that she did prior to injury.  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 Note was entered with these restrictions x 4 weeks per your instructions.  Patient will pick up.

## 2019-03-28 NOTE — Telephone Encounter (Signed)
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  Pt's call back # [phone]

## 2019-04-01 ENCOUNTER — Telehealth: Payer: Self-pay | Admitting: Orthopaedic Surgery

## 2019-04-01 NOTE — Telephone Encounter (Signed)
She has healed so whatever note she needs is fine

## 2019-04-01 NOTE — Telephone Encounter (Signed)
Sheila Holden with [employer] pt manager called in wanting to get specific information on pt's current work restrictions that lindsay has pt under.    [phone] ext: [phone]

## 2019-04-01 NOTE — Telephone Encounter (Signed)
See message below °

## 2019-04-01 NOTE — Telephone Encounter (Signed)
I sent message to Sheila Holden about this earlier.  She has healed and does not need restrictions.  Patient originally asked for restrictions, and that is why I did this.  I am ok with whatever patient wants

## 2019-04-04 NOTE — Telephone Encounter (Signed)
Tried calling back [place] the ext # did not work

## 2019-04-09 ENCOUNTER — Telehealth: Payer: Self-pay | Admitting: Orthopaedic Surgery

## 2019-04-09 NOTE — Telephone Encounter (Signed)
Received vn from [place] case manager w/ [employer],needs clarification if patient was seen in October as she received a work note. I called her back and lmvm. I advised patient last seen 9/25, that patient called in 10/2 requesting the work note and Dr Erlinda Hong accommodated her request.I faxed 9/25 to her [phone]. Ph [phone]

## 2019-04-16 ENCOUNTER — Ambulatory Visit (INDEPENDENT_AMBULATORY_CARE_PROVIDER_SITE_OTHER): Payer: No Typology Code available for payment source | Admitting: Orthopaedic Surgery

## 2019-04-16 ENCOUNTER — Other Ambulatory Visit: Payer: Self-pay

## 2019-04-16 DIAGNOSIS — S52135D Nondisplaced fracture of neck of left radius, subsequent encounter for closed fracture with routine healing: Secondary | ICD-10-CM | POA: Diagnosis not present

## 2019-04-16 NOTE — Progress Notes (Signed)
   Office Visit Note   Patient: Sheila Holden           Date of Birth: April 07, 1976           MRN: [id number] Visit Date: 04/16/2019              Requested by: No referring provider defined for this encounter. PCP: Sheila Holden   Assessment & Plan: Visit Diagnoses:  1. Closed nondisplaced fracture of neck of left radius with routine healing, subsequent encounter     Plan: Impression is left radial head fracture.  She is still having some discomfort with returning back to work.  Likely this is from deconditioning and fatigue.  I think that we should give her another month of the current restriction so that she can improve her strength.  She will continue with NSAIDs and home exercises and ice as needed.  I would like to recheck her in another month.  She still requires restrictions at that time we may need to consider an FCE.  The case manager was present during the encounter. Total face to face encounter time was greater than 25 minutes and over half of this time was spent in counseling and/or coordination of care.  Follow-Up Instructions: Return in about 4 weeks (around 05/14/2019).   Orders:  No orders of the defined types were placed in this encounter.  No orders of the defined types were placed in this encounter.     Procedures: No procedures performed   Clinical Data: No additional findings.   Subjective: Chief Complaint  Patient presents with  . Left Elbow - Follow-up, Fracture    Sheila Holden returns today for her left radial head fracture.  She is experiencing some worsening pain with going back to work.  She states that she is having to do more than what she was originally doing before she got injured.  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   Review of Systems   Objective: Vital Signs: There were no vitals taken for this visit.  Physical Exam  Ortho Exam Left elbow exam shows 5 degrees flexion contracture.  5  degrees of extension contracture.  She has full pronation supination without pain. Specialty Comments:  No specialty comments available.  Imaging: No results found.   PMFS History: There are no active problems to display for this patient.  No past medical history on file.  No family history on file.  Past Surgical History:  Procedure Laterality Date  . CESAREAN SECTION     Social History   Occupational History  . Not on file  Tobacco Use  . Smoking status: Never Smoker  . Smokeless tobacco: Never Used  Substance and Sexual Activity  . Alcohol use: Never    Frequency: Never  . Drug use: Never  . Sexual activity: Yes

## 2019-05-06 ENCOUNTER — Other Ambulatory Visit: Payer: Self-pay

## 2019-05-06 ENCOUNTER — Encounter: Payer: Self-pay | Admitting: Orthopaedic Surgery

## 2019-05-06 ENCOUNTER — Ambulatory Visit (INDEPENDENT_AMBULATORY_CARE_PROVIDER_SITE_OTHER): Payer: No Typology Code available for payment source | Admitting: Orthopaedic Surgery

## 2019-05-06 VITALS — Ht 65.0 in | Wt 173.0 lb

## 2019-05-06 DIAGNOSIS — S52135D Nondisplaced fracture of neck of left radius, subsequent encounter for closed fracture with routine healing: Secondary | ICD-10-CM

## 2019-05-06 NOTE — Progress Notes (Signed)
   Office Visit Note   Patient: Sheila Holden           Date of Birth: 1976-06-16           MRN: [id number] Visit Date: 05/06/2019              Requested by: No referring provider defined for this encounter. PCP: Patient, No Pcp Per   Assessment & Plan: Visit Diagnoses:  1. Closed nondisplaced fracture of neck of left radius with routine healing, subsequent encounter     Plan: Impression is nearly 15 weeks status post left radial neck fracture.  Radiographically, this has healed.  She has regained full motion strength but still exhibits intermittent pain despite all of this.  We will go ahead and obtain an MRI of the left elbow to assess for any structural abnormalities.  She will follow-up with [place] once that has been completed.  If her MRI is normal, we will then obtain an FCE.  This was all discussed with the case manager.  Follow-Up Instructions: Return for after MRI.   Orders:  Orders Placed This Encounter  Procedures  . MR Elbow Left w/o contrast   No orders of the defined types were placed in this encounter.     Procedures: No procedures performed   Clinical Data: No additional findings.   Subjective: Chief Complaint  Patient presents with  . 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  She has finished physical therapy where she has regained full range of motion and strength.  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  She still notes intermittent pain with certain motions of her elbow while working on the job.  Review of Systems as detailed in HPI.  All others reviewed and are negative.   Objective: Vital Signs: Ht 5\' 5"  (1.651 m)   Wt 173 lb (78.5 kg)   BMI 28.79 kg/m   Physical Exam well-developed well-nourished female in no acute distress.  Alert and oriented x3.  Ortho Exam  examination of the left elbow reveals mild tenderness to the radial head and neck.  Full range of motion and strength.  Normal exam of the shoulder.  She is neurovascularly intact distally.  Specialty Comments:  No specialty comments available.  Imaging: No new imaging   PMFS History: There are no active problems to display for this patient.  History reviewed. No pertinent past medical history.  History reviewed. No pertinent family history.  Past Surgical History:  Procedure Laterality Date  . CESAREAN SECTION     Social History   Occupational History  . Not on file  Tobacco Use  . Smoking status: Never Smoker  . Smokeless tobacco: Never Used  Substance and Sexual Activity  . Alcohol use: Never    Frequency: Never  . Drug use: Never  . Sexual activity: Yes

## 2019-05-30 ENCOUNTER — Encounter: Payer: Self-pay | Admitting: Occupational Therapy

## 2019-05-30 NOTE — Therapy (Signed)
[facility] [address] [place], [place], [address] Phone: [phone]   Fax:  [phone]  Patient Details  Name: Sheila Holden MRN: [id number] Date of Birth: 16-Oct-1975 Referring Provider:  No ref. provider found  Encounter Date: 05/30/2019  OCCUPATIONAL THERAPY DISCHARGE SUMMARY  Visits from Start of Care: 7  Current functional level related to goals / functional outcomes: OT Short Term Goals - 03/19/19 0730      OT SHORT TERM GOAL #1   Title  I with HEP.    Time  6    Period  Weeks    Status  Achieved    Target Date  03/22/19      OT SHORT TERM GOAL #2   Title  Pt will increase elbow flexion to 120  for increased functional use    Baseline  100    Time  6    Period  Weeks    Status  Achieved   02/14/19:  125*, 03/04/19  128*     OT SHORT TERM GOAL #3   Title  Pt will demonxtrate elbow ext to at least -15 in prep for functional reach.    Time  6    Period  Weeks    Status  Met   03/04/19  -23*.  03/19/19:  -5*     OT SHORT TERM GOAL #4   Title  Pt will demonstrate forearm pronation to 85 for increased functional use.    Time  4    Period  Weeks    Status  Achieved   03/04/19 85*     OT SHORT TERM GOAL #5   Title  Pt will report pain no greater than 4/10 with perfromance of HEP.    Time  4    Period  Weeks    Status  Achieved   03/04/19  3-4/10     OT SHORT TERM GOAL #6   Title  Pt will perform all basic ADLs modified indpendently using LUE as a non dominant assist.    Time  6    Period  Weeks    Status  Achieved         OT Long Term Goals - 03/19/19 0730      OT LONG TERM GOAL #1   Title  I updated with HEP.    Time  12    Period  Weeks    Status  Met     OT LONG TERM GOAL #2   Title  Pt will demonstrate elbow flexion/ extension WNLS for ADLs/ work activities.    Time  12    Period  Weeks    Status  Not fully met (flex 134* (vs. 140* nonaffected side) and ext -5*)     OT LONG TERM  GOAL #3   Title  Pt will demonstrate adequate LUE strength to retrieve a 3 lbs object from overhead shelf with LUE with pain no greater than 2/10.    Time  12    Period  Weeks    Status Unable to reassess     OT LONG TERM GOAL #4   Title  Pt will resume performance of home management and cooking activities modified independently using LUE as a non dominant assist.    Time  12    Period  Weeks    Status  Partially met--performing light tasks     OT LONG TERM GOAL #5   Title  Pt will perfrom simulated work activities  modified indpendently with pain no greater than 2/10,    Time  12    Period  Weeks    Status  Not fully met      Remaining deficits: decr strength, mild decr ROM, mild pain.  All goals not met/fully reassessed due to pt not returning for her last scheduled visit.  However, Epic MD notes indicate that she may not have returned due to being cleared to return to work with no restrictions   Education / Equipment: Pt instructed in HEP and verbalized understanding  Plan: Patient agrees to discharge.  Patient goals were partially met. Patient is being discharged due to not returning since the last visit.  ( but per Epic notes, pt was cleared to return to work by MD with no restrictions prior to last scheduled visit) ?????      [facility] 05/30/2019, 9:16 AM  [facility] [address] [place], [place], [address] Phone: [phone]   Fax:  [place], OTR/L [facility] [address] [place], [place]  [address] [phone] phone [phone] 05/30/19 9:16 AM

## 2019-05-31 ENCOUNTER — Other Ambulatory Visit: Payer: Self-pay

## 2019-05-31 ENCOUNTER — Ambulatory Visit
Admission: RE | Admit: 2019-05-31 | Discharge: 2019-05-31 | Disposition: A | Discharge status: Home / Self Care | Payer: BC Managed Care – PPO | Source: Ambulatory Visit | Attending: Physician Assistant | Admitting: Physician Assistant

## 2019-05-31 DIAGNOSIS — M19022 Primary osteoarthritis, left elbow: Secondary | ICD-10-CM | POA: Diagnosis not present

## 2019-05-31 DIAGNOSIS — S52135D Nondisplaced fracture of neck of left radius, subsequent encounter for closed fracture with routine healing: Secondary | ICD-10-CM

## 2019-05-31 DIAGNOSIS — M25522 Pain in left elbow: Secondary | ICD-10-CM | POA: Diagnosis not present

## 2019-06-02 NOTE — Progress Notes (Signed)
F/u to discuss

## 2019-06-05 ENCOUNTER — Ambulatory Visit: Payer: BC Managed Care – PPO | Admitting: Orthopaedic Surgery

## 2019-06-10 ENCOUNTER — Other Ambulatory Visit: Payer: Self-pay

## 2019-06-10 ENCOUNTER — Telehealth: Payer: Self-pay

## 2019-06-10 ENCOUNTER — Encounter: Payer: Self-pay | Admitting: Orthopaedic Surgery

## 2019-06-10 ENCOUNTER — Ambulatory Visit (INDEPENDENT_AMBULATORY_CARE_PROVIDER_SITE_OTHER): Payer: No Typology Code available for payment source | Admitting: Orthopaedic Surgery

## 2019-06-10 DIAGNOSIS — M25522 Pain in left elbow: Secondary | ICD-10-CM | POA: Diagnosis not present

## 2019-06-10 NOTE — Progress Notes (Signed)
   Office Visit Note   Patient: Sheila Holden           Date of Birth: 10-02-1975           MRN: [id number] Visit Date: 06/10/2019              Requested by: No referring provider defined for this encounter. PCP: Patient, No Pcp Per   Assessment & Plan: Visit Diagnoses:  1. 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  Otherwise MRI is unremarkable.  Based on these MRI findings I have offered the patient cortisone injection which she declined.  She will continue to take NSAIDs as needed.  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  Based on our discussion patient would like to move forward with an FCE.  We will see her back to review the FCE and for rate and release.  Follow-Up Instructions: Return if symptoms worsen or fail to improve.   Orders:  No orders of the defined types were placed in this encounter.  No orders of the defined types were placed in this encounter.     Procedures: No procedures performed   Clinical Data: No additional findings.   Subjective: Chief Complaint  Patient presents with  . Left Elbow - Pain    [name] returns today for MRI review of her left elbow.  She continues to remain out of work.   Review of Systems  Constitutional: Negative.   HENT: Negative.   Eyes: Negative.   Respiratory: Negative.   Cardiovascular: Negative.   Endocrine: Negative.   Musculoskeletal: Negative.   Neurological: Negative.   Hematological: Negative.   Psychiatric/Behavioral: Negative.   All other systems reviewed and are negative.    Objective: Vital Signs: There were no vitals taken for this visit.  Physical Exam Vitals and nursing note reviewed.  Constitutional:      Appearance: She is well-developed.  Pulmonary:     Effort:  Pulmonary effort is normal.  Skin:    General: Skin is warm.     Capillary Refill: Capillary refill takes less than 2 seconds.  Neurological:     Mental Status: She is alert and oriented to person, place, and time.  Psychiatric:        Behavior: Behavior normal.        Thought Content: Thought content normal.        Judgment: Judgment normal.     Ortho Exam Left elbow exam shows no joint effusion.  Normal range of motion with mild discomfort. Specialty Comments:  No specialty comments available.  Imaging: No results found.   PMFS History: Patient Active Problem List   Diagnosis Date Noted  . Pain in left elbow 06/10/2019   History reviewed. No pertinent past medical history.  History reviewed. No pertinent family history.  Past Surgical History:  Procedure Laterality Date  . CESAREAN SECTION     Social History   Occupational History  . Not on file  Tobacco Use  . Smoking status: Never Smoker  . Smokeless tobacco: Never Used  Substance and Sexual Activity  . Alcohol use: Never  . Drug use: Never  . Sexual activity: Yes

## 2019-06-10 NOTE — Telephone Encounter (Signed)
Patient needs an FCE. Thank you. Emailed Music therapist.

## 2019-06-23 ENCOUNTER — Telehealth: Payer: Self-pay | Admitting: Orthopaedic Surgery

## 2019-06-23 NOTE — Telephone Encounter (Signed)
See message. Order was emailed to you. Let me know if I need to do something else. Thank you.

## 2019-06-23 NOTE — Telephone Encounter (Signed)
Patient called. She says that she was suppose to have a FCE done. She never got a call. Looking in Epic, there was not a referral for a FCE put in. She would like a referral for the FCE . Her call back number is [phone]

## 2019-07-08 ENCOUNTER — Telehealth: Payer: Self-pay | Admitting: Physician Assistant

## 2019-07-08 NOTE — Telephone Encounter (Signed)
Patient decided not to take FCE exam patient wants to be released [place] backto work.  [phone]

## 2019-07-09 NOTE — Telephone Encounter (Signed)
Note ready for pick up. Called patient no answer LMOM.  

## 2019-07-09 NOTE — Telephone Encounter (Signed)
Restrictions

## 2019-07-09 NOTE — Telephone Encounter (Signed)
RTW Full duty?

## 2019-07-09 NOTE — Telephone Encounter (Signed)
none

## 2019-07-09 NOTE — Telephone Encounter (Signed)
ok 

## 2019-07-10 ENCOUNTER — Telehealth: Payer: Self-pay | Admitting: Orthopaedic Surgery

## 2019-07-10 NOTE — Telephone Encounter (Signed)
Patient called and stated to send message to Dr. Roda Shutters. Patient stated she has had her FCE exam. Patient phone number is [phone].

## 2019-07-10 NOTE — Telephone Encounter (Signed)
Ok she needs f/u appt to go over [employer]

## 2019-07-10 NOTE — Telephone Encounter (Signed)
So now she had her FCE done

## 2019-07-11 NOTE — Telephone Encounter (Signed)
FCE is scheduled on 1/22 Sheila Holden PT- [facility]. Will go ahead and make her an appt with Dr Roda Shutters.

## 2019-07-25 ENCOUNTER — Other Ambulatory Visit: Payer: Self-pay

## 2019-07-25 ENCOUNTER — Ambulatory Visit (INDEPENDENT_AMBULATORY_CARE_PROVIDER_SITE_OTHER): Payer: No Typology Code available for payment source | Admitting: Orthopaedic Surgery

## 2019-07-25 ENCOUNTER — Encounter: Payer: Self-pay | Admitting: Orthopaedic Surgery

## 2019-07-25 DIAGNOSIS — M25522 Pain in left elbow: Secondary | ICD-10-CM | POA: Diagnosis not present

## 2019-07-25 NOTE — Progress Notes (Signed)
   Office Visit Note   Patient: Sheila Holden           Date of Birth: 1976/06/23           MRN: [id number] Visit Date: 07/25/2019              Requested by: No referring provider defined for this encounter. PCP: Patient, No Pcp Per   Assessment & Plan: Visit Diagnoses:  1. Pain in left elbow     Plan: FCE was reviewed with the patient in detail. The physical demand level is medium. At this point patient has reached MMI and her PPI is 5% of the left arm. All of this was discussed in detail with the patient and the case manager. Questions encouraged and answered. Total face to face encounter time was greater than 25 minutes and over half of this time was spent in counseling and/or coordination of care.  Follow-Up Instructions: Return if symptoms worsen or fail to improve.   Orders:  No orders of the defined types were placed in this encounter.  No orders of the defined types were placed in this encounter.     Procedures: No procedures performed   Clinical Data: No additional findings.   Subjective: No chief complaint on file.   Patient returns today for review of her FCE. No changes in medical history.   Review of Systems   Objective: Vital Signs: There were no vitals taken for this visit.  Physical Exam  Ortho Exam Left elbow exam shows minimal limitation range of motion. Specialty Comments:  No specialty comments available.  Imaging: No results found.   PMFS History: Patient Active Problem List   Diagnosis Date Noted  . Pain in left elbow 06/10/2019   History reviewed. No pertinent past medical history.  History reviewed. No pertinent family history.  Past Surgical History:  Procedure Laterality Date  . CESAREAN SECTION     Social History   Occupational History  . Not on file  Tobacco Use  . Smoking status: Never Smoker  . Smokeless tobacco: Never Used  Substance and Sexual Activity  . Alcohol use: Never  . Drug use: Never  . Sexual  activity: Yes

## 2019-08-25 IMAGING — CR DG CHEST 2V
1 series · 2 of 2 positions shown · non-contrast
Comparison: None.

CLINICAL DATA: Central chest pain starting yesterday at work and
intermittent in nature. No dyspnea.

EXAM:
CHEST - 2 VIEW

[Series 1: w chest pa · 0.14mm/px · 2 of 2 slices shown]
[im 1/2]
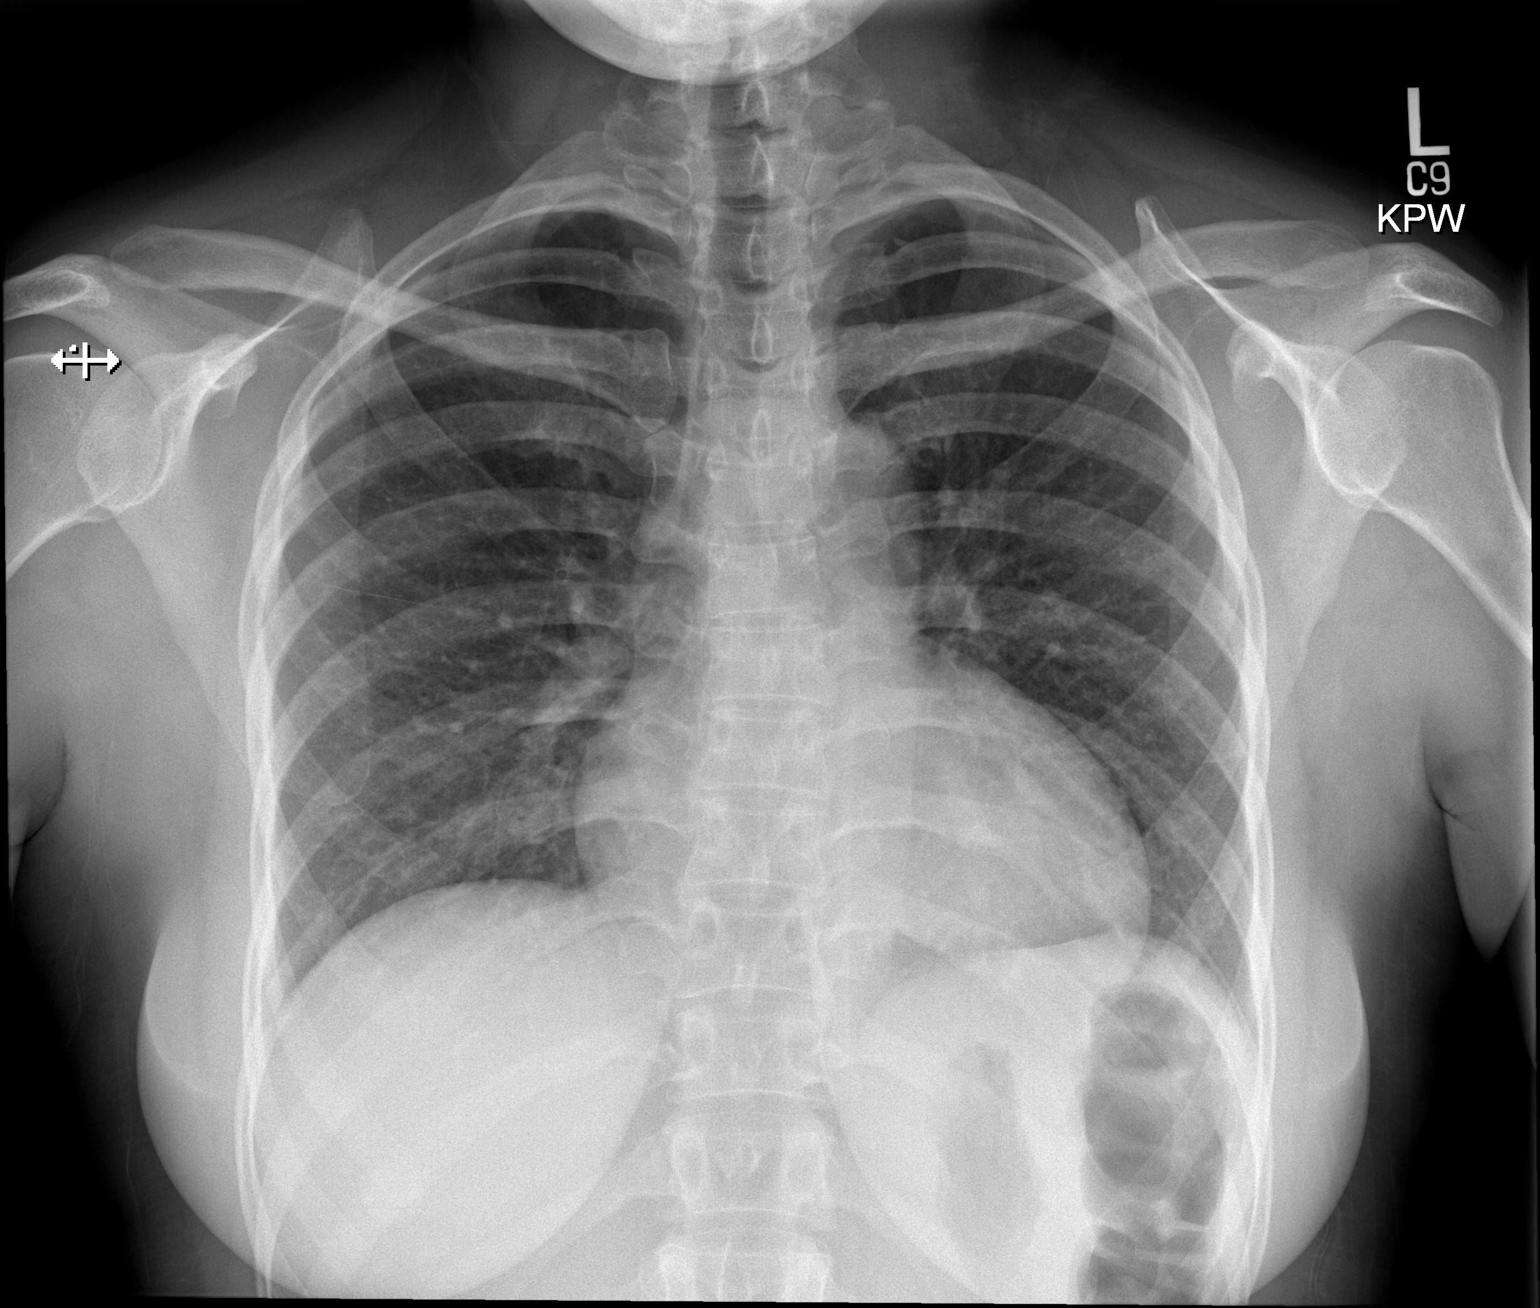
[im 2/2]
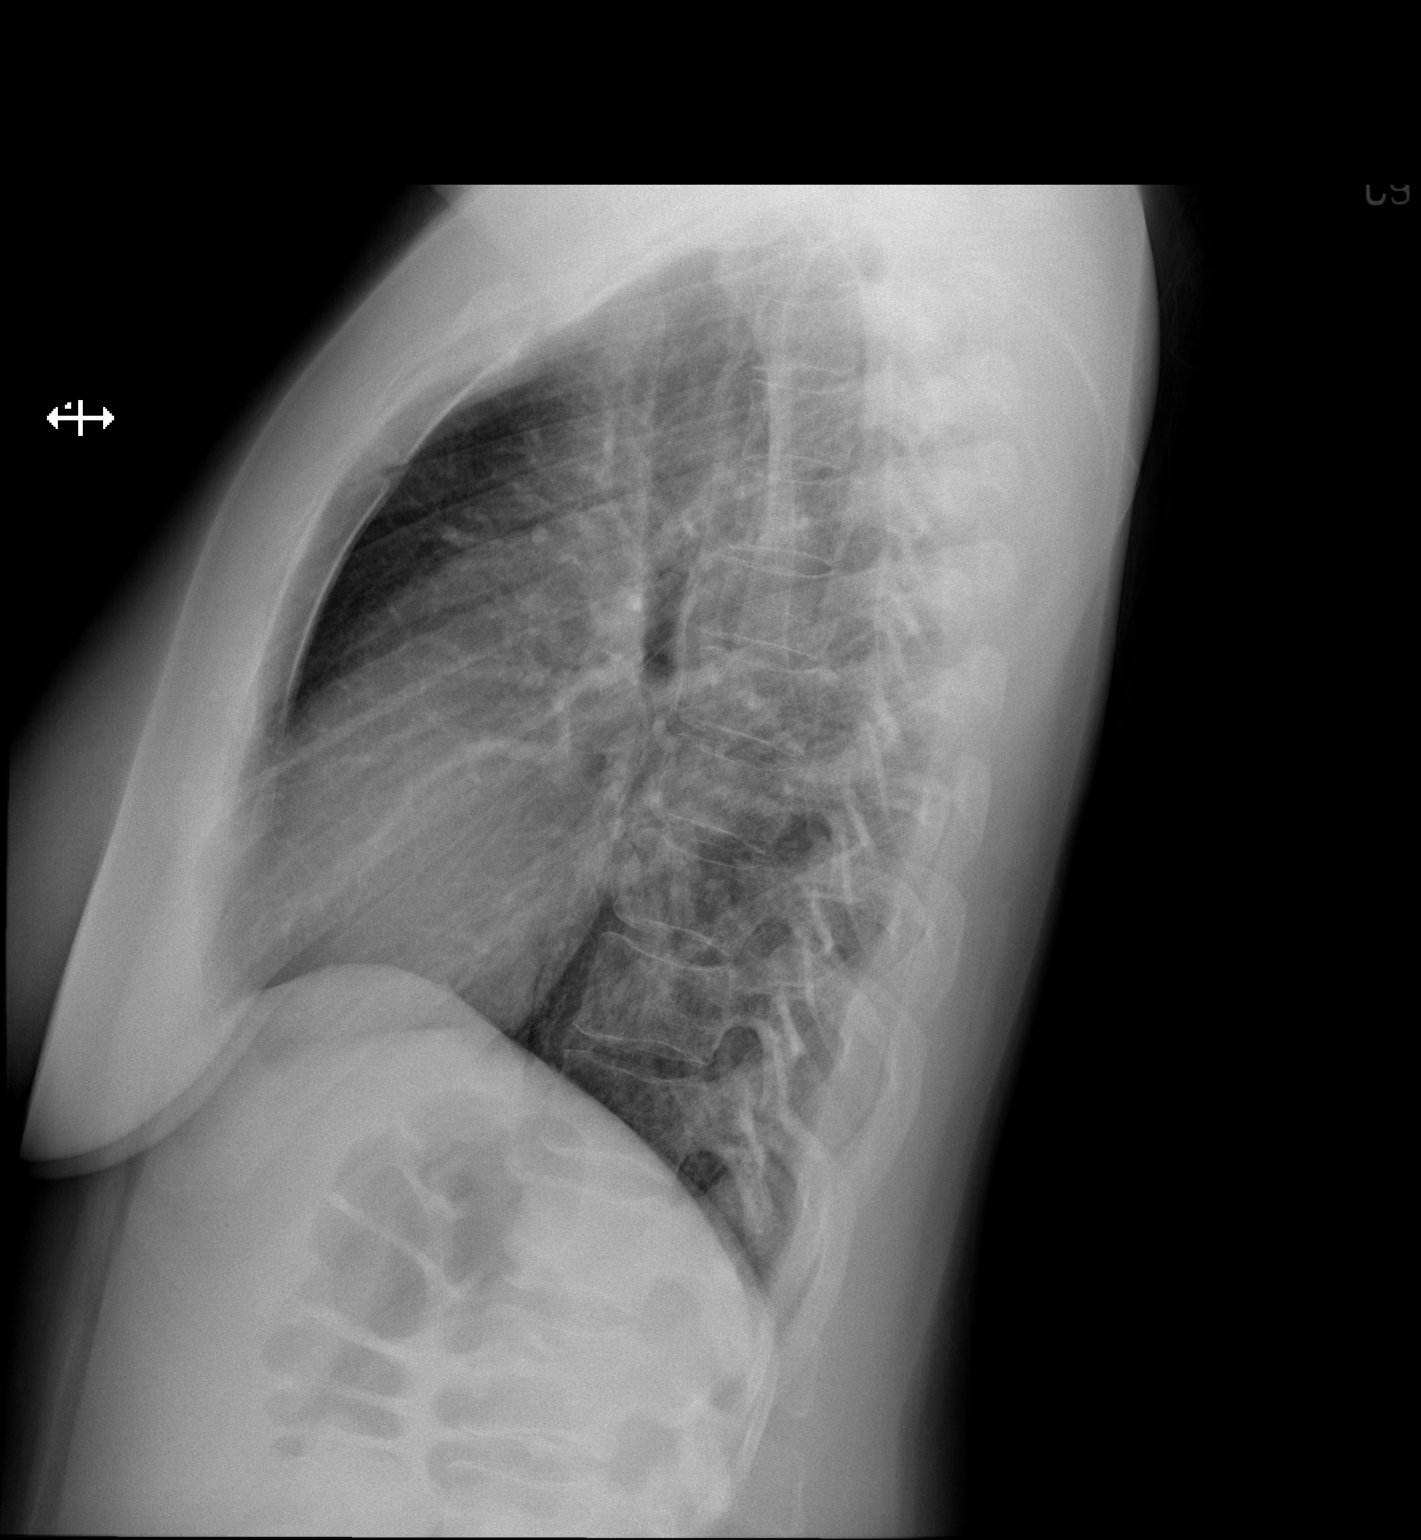

[2 of 2 positions shown; findings below may reference images not displayed]

FINDINGS: The cardiopericardial silhouette is slightly more prominent than on
prior exam, some which may be due to projection. Nonaneurysmal
thoracic aorta. Clear lungs without effusion or pneumothorax. No
overt pulmonary edema. No acute osseous abnormality.
IMPRESSION: No active cardiopulmonary disease.

## 2020-02-09 ENCOUNTER — Ambulatory Visit: Payer: Self-pay

## 2022-04-07 ENCOUNTER — Other Ambulatory Visit: Payer: Self-pay

## 2022-04-07 ENCOUNTER — Encounter (HOSPITAL_COMMUNITY): Payer: Self-pay | Admitting: Emergency Medicine

## 2022-04-07 ENCOUNTER — Emergency Department (HOSPITAL_COMMUNITY)
Admission: EM | Admit: 2022-04-07 | Discharge: 2022-04-07 | Disposition: A | Discharge status: Home / Self Care | Payer: BC Managed Care – PPO | Attending: Emergency Medicine | Admitting: Emergency Medicine

## 2022-04-07 DIAGNOSIS — R42 Dizziness and giddiness: Secondary | ICD-10-CM | POA: Insufficient documentation

## 2022-04-07 LAB — COMPREHENSIVE METABOLIC PANEL
ALT: 22 U/L (ref 0–44)
AST: 25 U/L (ref 15–41)
Albumin: 4.1 g/dL (ref 3.5–5.0)
Alkaline Phosphatase: 93 U/L (ref 38–126)
Anion gap: 9 (ref 5–15)
BUN: 7 mg/dL (ref 6–20)
CO2: 27 mmol/L (ref 22–32)
Calcium: 9.3 mg/dL (ref 8.9–10.3)
Chloride: 101 mmol/L (ref 98–111)
Creatinine, Ser: 0.69 mg/dL (ref 0.44–1.00)
GFR, Estimated: >60 mL/min (ref >60)
Glucose, Bld: 117 mg/dL — ABNORMAL HIGH (ref 70–99)
Potassium: 3.6 mmol/L (ref 3.5–5.1)
Sodium: 137 mmol/L (ref 135–145)
Total Bilirubin: 0.8 mg/dL (ref 0.3–1.2)
Total Protein: 7.4 g/dL (ref 6.5–8.1)

## 2022-04-07 LAB — CBC WITH DIFFERENTIAL/PLATELET
Abs Immature Granulocytes: 0.03 10*3/uL (ref 0.00–0.07)
Basophils Absolute: 0 10*3/uL (ref 0.0–0.1)
Basophils Relative: 0 %
Eosinophils Absolute: 0.1 10*3/uL (ref 0.0–0.5)
Eosinophils Relative: 1 %
HCT: 39.3 % (ref 36.0–46.0)
Hemoglobin: 12.7 g/dL (ref 12.0–15.0)
Immature Granulocytes: 0 %
Lymphocytes Relative: 26 %
Lymphs Abs: 2.1 10*3/uL (ref 0.7–4.0)
MCH: 28.3 pg (ref 26.0–34.0)
MCHC: 32.3 g/dL (ref 30.0–36.0)
MCV: 87.7 fL (ref 80.0–100.0)
Monocytes Absolute: 0.5 10*3/uL (ref 0.1–1.0)
Monocytes Relative: 6 %
Neutro Abs: 5.5 10*3/uL (ref 1.7–7.7)
Neutrophils Relative %: 67 %
Platelets: 241 10*3/uL (ref 150–400)
RBC: 4.48 MIL/uL (ref 3.87–5.11)
RDW: 13.2 % (ref 11.5–15.5)
WBC: 8.2 10*3/uL (ref 4.0–10.5)
nRBC: 0 % (ref 0.0–0.2)

## 2022-04-07 LAB — I-STAT BETA HCG BLOOD, ED (MC, WL, AP ONLY): I-stat hCG, quantitative: <5 m[IU]/mL (ref <5)

## 2022-04-07 NOTE — Discharge Instructions (Signed)
1.  I recommend follow-up with a family doctor and also occupational health.  Her company may have an occupational health facility that they specifically use.  [facility] has occupational health center at [place] [phone]  [phone]. 2.  At this time it is not clear what is causing your symptoms but it might be associated with exposure to fumes or chemicals while you are driving.  This could be difficult to determine and will require further evaluation.  Your best options at this time are a family physician and also in occupational health evaluation. 3.  Return to the emergency department if you have new worsening or persisting symptoms.

## 2022-04-07 NOTE — ED Provider Notes (Signed)
[facility] EMERGENCY DEPARTMENT Provider Note   CSN: [id number] Arrival date & time: 04/07/22  7564     History {Add pertinent medical, surgical, social history, OB history to HPI:1} Chief Complaint  Patient presents with   Dizzy/Lightheaded    Sheila Holden is a 46 y.o. female.  HPI Patient reports has been having problems since early September with episodes of dizziness and lightheadedness while she is driving her work vehicle.  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  She is used to driving being vehicles.  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  She reports for the first couple months she was not having any problems.  She reports now since September she has been getting episodes of feeling lightheaded and dizzy while she is driving the truck.  She reports she feels like she cannot drive [employer] 30 miles an hour because she is worried that she might lose control due to dizziness or lightheadedness.  She does not specifically endorse vertigo type symptoms.  He denies any severe headaches.  She reports however sometimes she does have more mild just general aching headaches.  She denies any double vision or loss of vision.  She does reports that nighttime driving is more challenging.  He is not experiencing any chest pain.  No focal weakness numbness or tingling in her extremities.  No difficulty walking.  She does not describe the symptoms occurring at any other times besides when she is driving.  She denies any injuries or trauma or anxiety regarding the vehicle.  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  She reports fumes do sometimes come into the truck.  Patient denies any drug or alcohol use.  She reports that for her job she is routinely undergoing drug screening and does not use any drugs.    Home Medications Prior to Admission medications    Medication Sig Start Date End Date Taking? Authorizing Provider  cyclobenzaprine (FLEXERIL) 10 MG tablet Take 1 tablet (10 mg total) by mouth 3 (three) times daily as needed for muscle spasms. 07/17/18   Willy Eddy, MD  traMADol (ULTRAM-ER) 100 MG 24 hr tablet Take 1 tablet (100 mg total) by mouth 3 (three) times daily as needed for pain. 01/24/19   Cristie Hem, PA-C      Allergies    Patient has no known allergies.    Review of Systems   Review of Systems  Physical Exam Updated Vital Signs BP 114/70   Pulse 77   Temp 98.3 F (36.8 C) (Oral)   Resp 18   SpO2 100%  Physical Exam Constitutional:      Comments: Alert nontoxic clinically well in appearance.  Well-nourished well-developed.  HENT:     Head: Normocephalic and atraumatic.     Ears:     Comments: Soft cerumen bilateral ears.    Nose: Nose normal.     Mouth/Throat:     Mouth: Mucous membranes are moist.     Pharynx: Oropharynx is clear.  Eyes:     Extraocular Movements: Extraocular movements intact.     Conjunctiva/sclera: Conjunctivae normal.     Pupils: Pupils are equal, round, and reactive to light.  Cardiovascular:     Rate and Rhythm: Normal rate and regular rhythm.  Pulmonary:     Effort: Pulmonary effort is normal.  Breath sounds: Normal breath sounds.  Abdominal:     General: There is no distension.     Palpations: Abdomen is soft.     Tenderness: There is no abdominal tenderness. There is no guarding.  Musculoskeletal:        General: No swelling, tenderness or signs of injury. Normal range of motion.     Cervical back: Neck supple.     Right lower leg: No edema.     Left lower leg: No edema.  Skin:    General: Skin is warm and dry.  Neurological:     General: No focal deficit present.     Mental Status: She is oriented to person, place, and time.     Cranial Nerves: No cranial nerve deficit.     Motor: No weakness.     Coordination: Coordination normal.     Gait: Gait normal.      Deep Tendon Reflexes: Reflexes normal.     Comments: Cranial nerves II through XII intact.  Speech is normal.  Finger-nose exam bilaterally.  Movements coordinated purposeful symmetric.  Gait steady.  Psychiatric:        Mood and Affect: Mood normal.     ED Results / Procedures / Treatments   Labs (all labs ordered are listed, but only abnormal results are displayed) Labs Reviewed  COMPREHENSIVE METABOLIC PANEL - Abnormal; Notable for the following components:      Result Value   Glucose, Bld 117 (*)    All other components within normal limits  CBC WITH DIFFERENTIAL/PLATELET  COOXEMETRY PANEL  I-STAT BETA HCG BLOOD, ED ([facility], [facility], [facility] ONLY)    EKG EKG Interpretation  Date/Time:  Friday April 07 2022 06:52:17 EDT Ventricular Rate:  83 PR Interval:  164 QRS Duration: 86 QT Interval:  364 QTC Calculation: 427 R Axis:   76 Text Interpretation: Normal sinus rhythm Normal ECG When compared with ECG of 17-Jul-2018 13:52, PREVIOUS ECG IS PRESENT no change from previous Confirmed by Charlesetta Shanks [phone]) on 04/07/2022 4:51:37 PM  Radiology No results found.  Procedures Procedures  {Document cardiac monitor, telemetry assessment procedure when appropriate:1}  Medications Ordered in ED Medications - No data to display  ED Course/ Medical Decision Making/ A&P                           Medical Decision Making Amount and/or Complexity of Data Reviewed Labs: ordered.   Patient presents with symptoms that are  {Document critical care time when appropriate:1} {Document review of labs and clinical decision tools ie heart score, Chads2Vasc2 etc:1}  {Document your independent review of radiology images, and any outside records:1} {Document your discussion with family members, caretakers, and with consultants:1} {Document social determinants of health affecting pt's care:1} {Document your decision making why or why not admission, treatments were needed:1} Final Clinical  Impression(s) / ED Diagnoses Final diagnoses:  Lightheadedness    Rx / DC Orders ED Discharge Orders     None

## 2022-04-07 NOTE — ED Triage Notes (Signed)
Patient reports feeling dizzy/lightheaded for several weeks .
